# Patient Record
Sex: Female | Born: 1935
Health system: Southern US, Community
[De-identification: ages and names within clinical notes are randomized; demographics above are authoritative.]

## PROBLEM LIST (undated history)

## (undated) DIAGNOSIS — C50919 Malignant neoplasm of unspecified site of unspecified female breast: Secondary | ICD-10-CM

## (undated) DIAGNOSIS — D126 Benign neoplasm of colon, unspecified: Secondary | ICD-10-CM

## (undated) DIAGNOSIS — K589 Irritable bowel syndrome without diarrhea: Secondary | ICD-10-CM

## (undated) DIAGNOSIS — K449 Diaphragmatic hernia without obstruction or gangrene: Secondary | ICD-10-CM

## (undated) DIAGNOSIS — K5790 Diverticulosis of intestine, part unspecified, without perforation or abscess without bleeding: Secondary | ICD-10-CM

## (undated) DIAGNOSIS — E785 Hyperlipidemia, unspecified: Secondary | ICD-10-CM

## (undated) DIAGNOSIS — I10 Essential (primary) hypertension: Secondary | ICD-10-CM

## (undated) DIAGNOSIS — K648 Other hemorrhoids: Secondary | ICD-10-CM

## (undated) DIAGNOSIS — M1990 Unspecified osteoarthritis, unspecified site: Secondary | ICD-10-CM

## (undated) DIAGNOSIS — K219 Gastro-esophageal reflux disease without esophagitis: Secondary | ICD-10-CM

## (undated) DIAGNOSIS — I8390 Asymptomatic varicose veins of unspecified lower extremity: Secondary | ICD-10-CM

## (undated) HISTORY — DX: Benign neoplasm of colon, unspecified: D12.6

## (undated) HISTORY — DX: Unspecified osteoarthritis, unspecified site: M19.90

## (undated) HISTORY — DX: Other hemorrhoids: K64.8

## (undated) HISTORY — DX: Hyperlipidemia, unspecified: E78.5

## (undated) HISTORY — DX: Irritable bowel syndrome, unspecified: K58.9

## (undated) HISTORY — DX: Essential (primary) hypertension: I10

## (undated) HISTORY — PX: CYSTECTOMY: SUR359

## (undated) HISTORY — DX: Gastro-esophageal reflux disease without esophagitis: K21.9

## (undated) HISTORY — DX: Diverticulosis of intestine, part unspecified, without perforation or abscess without bleeding: K57.90

## (undated) HISTORY — DX: Diaphragmatic hernia without obstruction or gangrene: K44.9

## (undated) HISTORY — DX: Malignant neoplasm of unspecified site of unspecified female breast: C50.919

## (undated) HISTORY — DX: Asymptomatic varicose veins of unspecified lower extremity: I83.90

## (undated) HISTORY — PX: CATARACT EXTRACTION, BILATERAL: SHX1313

---

## 1985-06-15 DIAGNOSIS — C50919 Malignant neoplasm of unspecified site of unspecified female breast: Secondary | ICD-10-CM | POA: Insufficient documentation

## 1985-06-15 HISTORY — PX: MASTECTOMY: SHX3

## 1993-02-13 DIAGNOSIS — D126 Benign neoplasm of colon, unspecified: Secondary | ICD-10-CM

## 1993-02-13 HISTORY — DX: Benign neoplasm of colon, unspecified: D12.6

## 1998-01-11 ENCOUNTER — Other Ambulatory Visit: Admission: RE | Admit: 1998-01-11 | Discharge: 1998-01-11 | Payer: Self-pay | Admitting: Gastroenterology

## 1998-05-24 ENCOUNTER — Ambulatory Visit (HOSPITAL_COMMUNITY): Admission: RE | Admit: 1998-05-24 | Discharge: 1998-05-24 | Payer: Self-pay | Admitting: Orthopaedic Surgery

## 1998-09-19 ENCOUNTER — Other Ambulatory Visit: Admission: RE | Admit: 1998-09-19 | Discharge: 1998-09-19 | Payer: Self-pay | Admitting: Obstetrics & Gynecology

## 1998-10-16 ENCOUNTER — Ambulatory Visit (HOSPITAL_COMMUNITY): Admission: RE | Admit: 1998-10-16 | Discharge: 1998-10-16 | Payer: Self-pay | Admitting: Orthopaedic Surgery

## 1999-03-31 ENCOUNTER — Encounter: Payer: Self-pay | Admitting: Gastroenterology

## 2000-02-11 ENCOUNTER — Other Ambulatory Visit: Admission: RE | Admit: 2000-02-11 | Discharge: 2000-02-11 | Payer: Self-pay | Admitting: Obstetrics & Gynecology

## 2003-07-02 ENCOUNTER — Other Ambulatory Visit: Admission: RE | Admit: 2003-07-02 | Discharge: 2003-07-02 | Payer: Self-pay | Admitting: Obstetrics & Gynecology

## 2004-04-04 ENCOUNTER — Encounter: Payer: Self-pay | Admitting: Gastroenterology

## 2004-04-04 DIAGNOSIS — K648 Other hemorrhoids: Secondary | ICD-10-CM | POA: Insufficient documentation

## 2004-04-04 DIAGNOSIS — K5730 Diverticulosis of large intestine without perforation or abscess without bleeding: Secondary | ICD-10-CM | POA: Insufficient documentation

## 2004-05-09 ENCOUNTER — Emergency Department (HOSPITAL_COMMUNITY): Admission: EM | Admit: 2004-05-09 | Discharge: 2004-05-09 | Payer: Self-pay | Admitting: Emergency Medicine

## 2004-05-10 ENCOUNTER — Ambulatory Visit (HOSPITAL_COMMUNITY): Admission: RE | Admit: 2004-05-10 | Discharge: 2004-05-10 | Payer: Self-pay | Admitting: Emergency Medicine

## 2005-07-16 ENCOUNTER — Ambulatory Visit: Payer: Self-pay | Admitting: Gastroenterology

## 2005-09-03 ENCOUNTER — Ambulatory Visit: Payer: Self-pay | Admitting: Gastroenterology

## 2005-10-29 ENCOUNTER — Ambulatory Visit: Payer: Self-pay | Admitting: Gastroenterology

## 2006-09-28 ENCOUNTER — Ambulatory Visit: Payer: Self-pay | Admitting: Internal Medicine

## 2006-11-29 ENCOUNTER — Ambulatory Visit: Payer: Self-pay | Admitting: Gastroenterology

## 2007-10-27 DIAGNOSIS — T7840XA Allergy, unspecified, initial encounter: Secondary | ICD-10-CM | POA: Insufficient documentation

## 2007-10-27 DIAGNOSIS — J45909 Unspecified asthma, uncomplicated: Secondary | ICD-10-CM | POA: Insufficient documentation

## 2007-10-27 DIAGNOSIS — K219 Gastro-esophageal reflux disease without esophagitis: Secondary | ICD-10-CM | POA: Insufficient documentation

## 2007-10-27 DIAGNOSIS — I158 Other secondary hypertension: Secondary | ICD-10-CM | POA: Insufficient documentation

## 2007-10-27 DIAGNOSIS — K449 Diaphragmatic hernia without obstruction or gangrene: Secondary | ICD-10-CM | POA: Insufficient documentation

## 2007-10-27 DIAGNOSIS — E785 Hyperlipidemia, unspecified: Secondary | ICD-10-CM | POA: Insufficient documentation

## 2007-11-28 ENCOUNTER — Encounter: Payer: Self-pay | Admitting: Gastroenterology

## 2007-12-02 DIAGNOSIS — Z860100 Personal history of colon polyps, unspecified: Secondary | ICD-10-CM | POA: Insufficient documentation

## 2007-12-05 ENCOUNTER — Ambulatory Visit: Payer: Self-pay | Admitting: Gastroenterology

## 2008-06-09 IMAGING — CR DG CHEST 2V
2 series · 2 of 2 positions shown · non-contrast
Comparison: None

CLINICAL DATA: Cough

CHEST - 2 VIEW:

[view not recorded (1 of 2)]
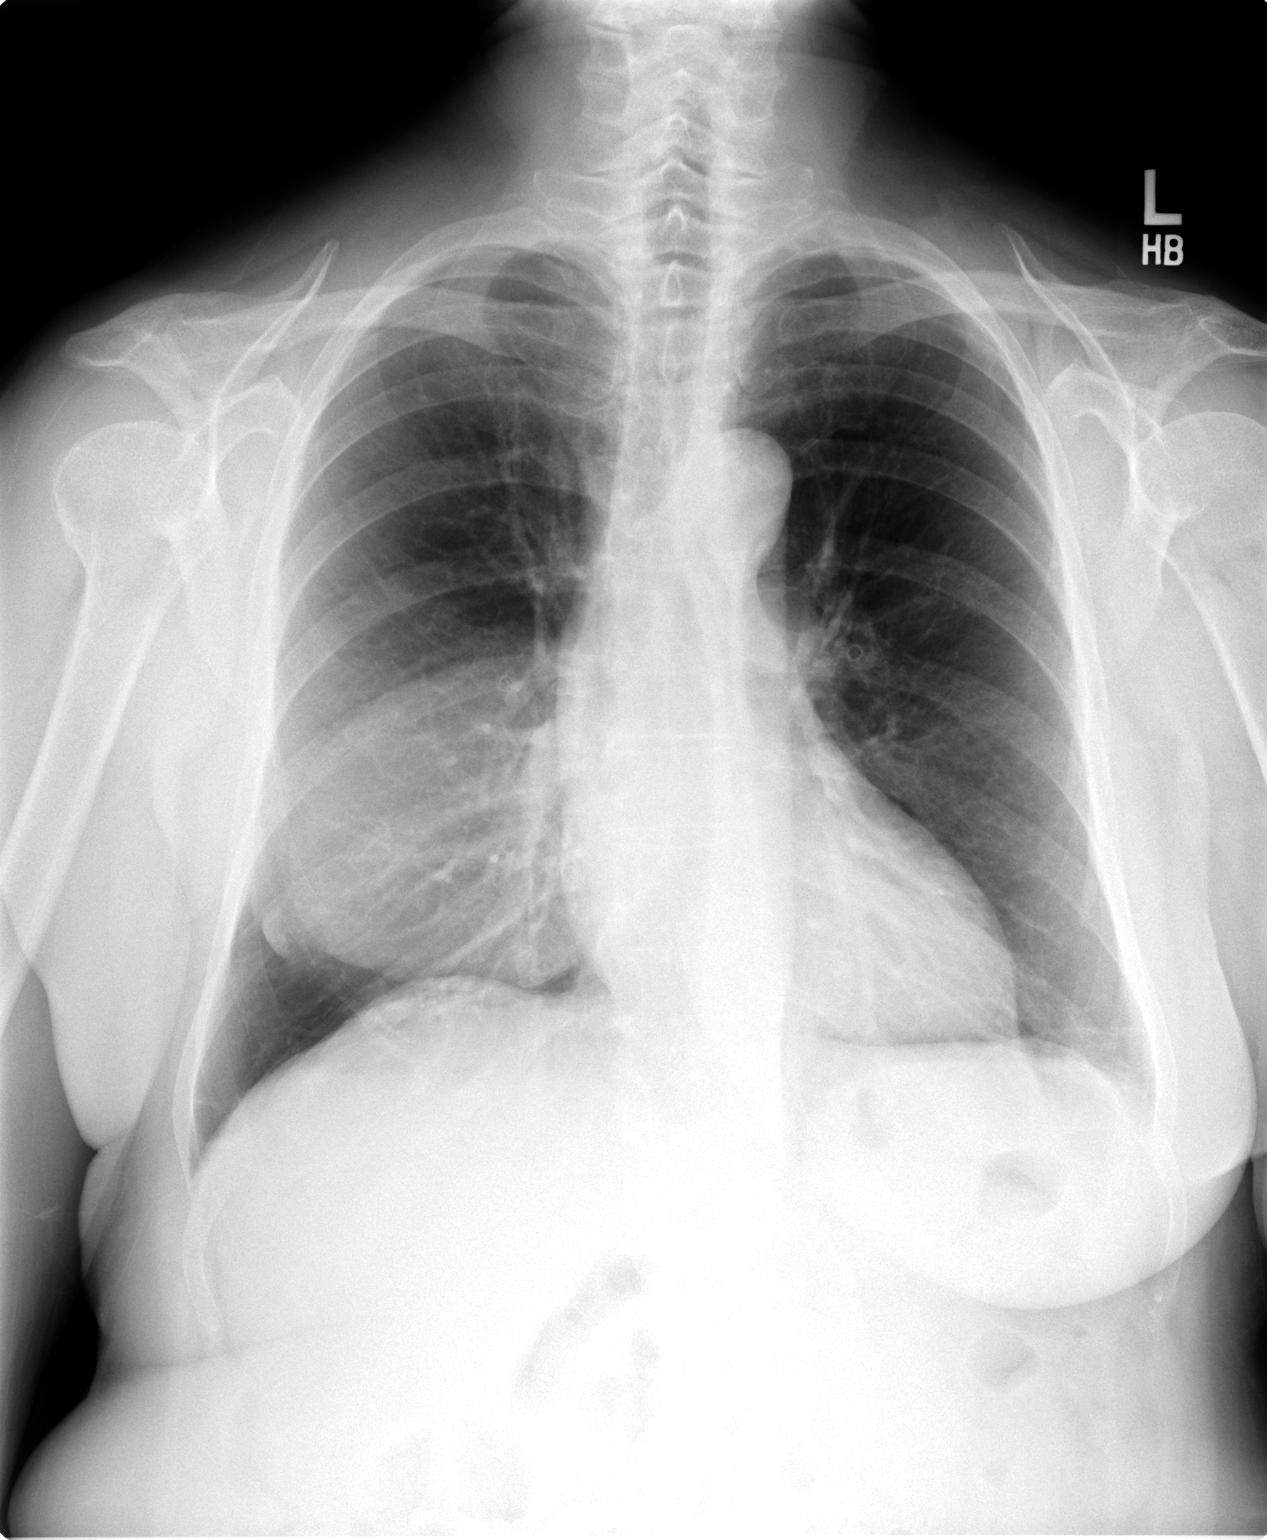

[view not recorded (2 of 2)]
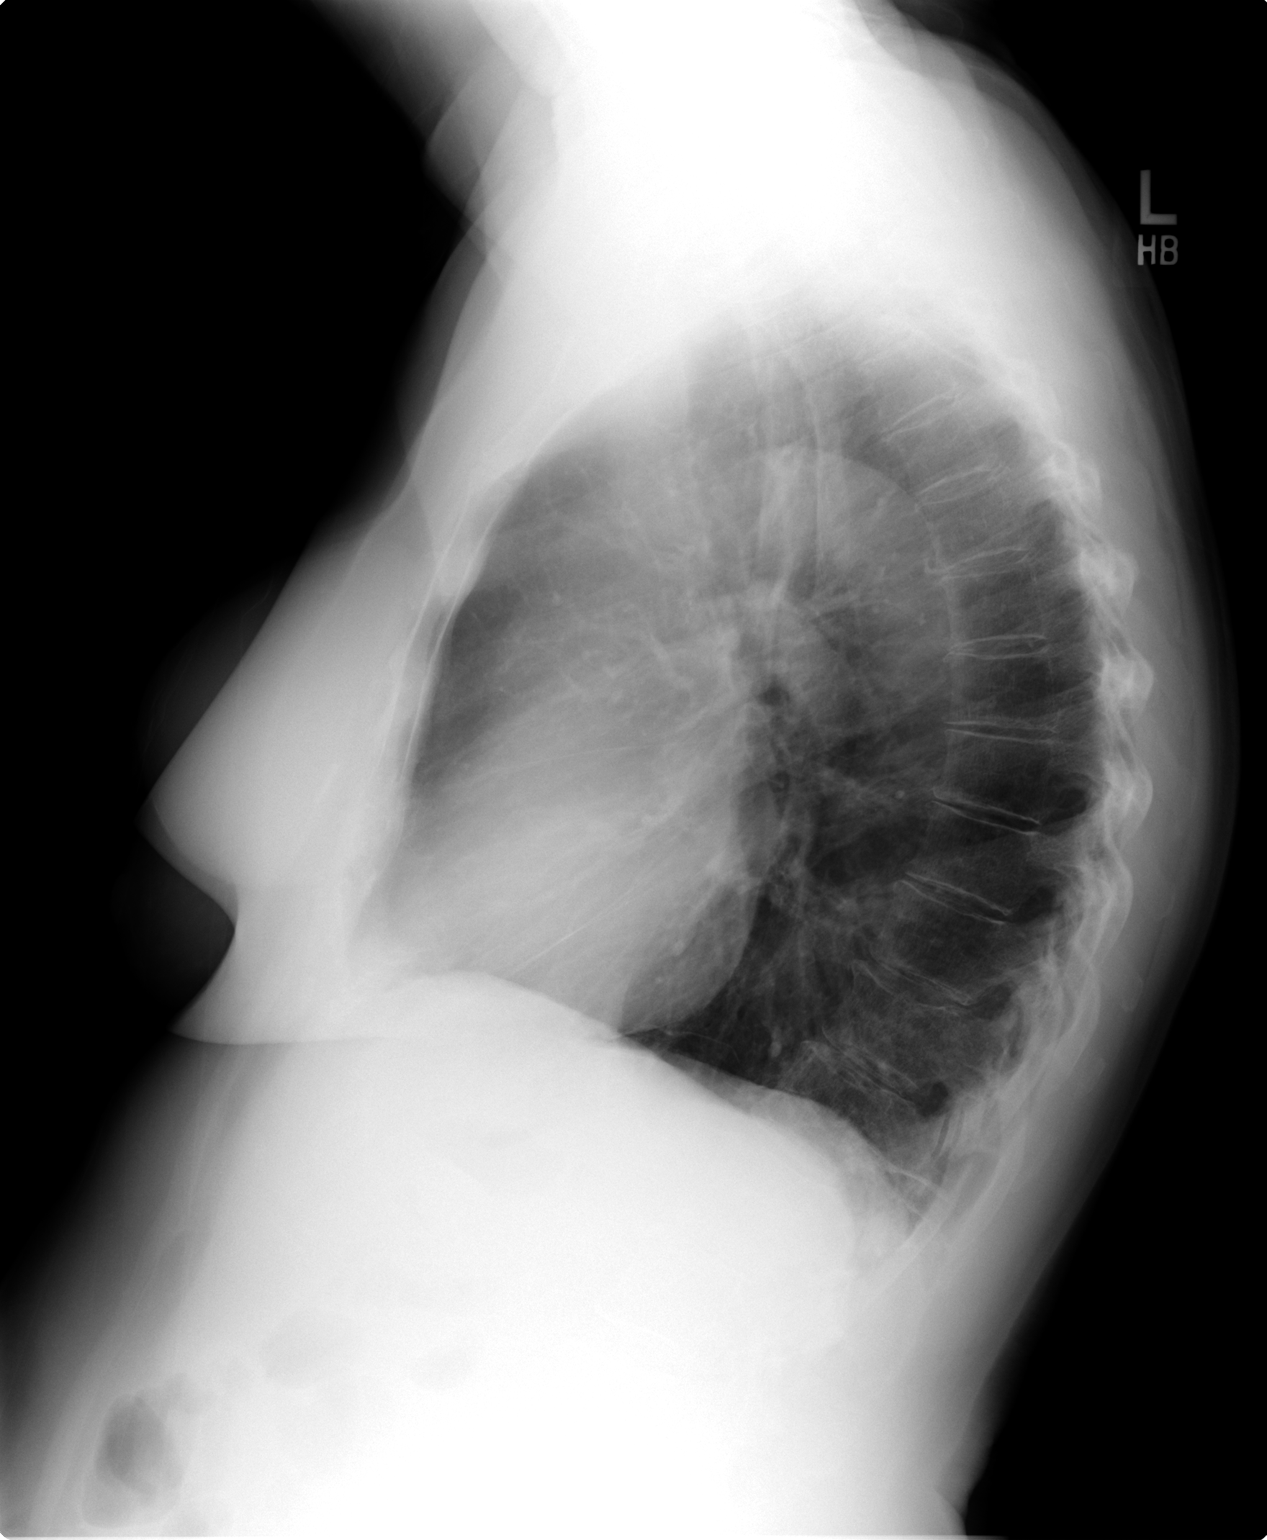

[2 of 2 positions shown; findings below may reference images not displayed]

FINDINGS: Cardiomediastinal contours are within normal limits. No focal air
space opacities or effusions in the lungs. Visualized skeleton unremarkable.
Right breast implant noted.
IMPRESSION: No active disease.

## 2008-12-14 ENCOUNTER — Encounter: Payer: Self-pay | Admitting: Gastroenterology

## 2009-01-08 ENCOUNTER — Ambulatory Visit: Payer: Self-pay | Admitting: Gastroenterology

## 2009-01-08 DIAGNOSIS — K589 Irritable bowel syndrome without diarrhea: Secondary | ICD-10-CM | POA: Insufficient documentation

## 2009-02-14 ENCOUNTER — Encounter: Payer: Self-pay | Admitting: Nurse Practitioner

## 2009-02-25 ENCOUNTER — Telehealth: Payer: Self-pay | Admitting: Gastroenterology

## 2009-02-25 ENCOUNTER — Ambulatory Visit: Payer: Self-pay | Admitting: Gastroenterology

## 2009-02-25 DIAGNOSIS — R197 Diarrhea, unspecified: Secondary | ICD-10-CM | POA: Insufficient documentation

## 2009-02-25 DIAGNOSIS — R1084 Generalized abdominal pain: Secondary | ICD-10-CM | POA: Insufficient documentation

## 2009-02-25 LAB — CONVERTED CEMR LAB
Bilirubin Urine: NEGATIVE
Ketones, ur: NEGATIVE mg/dL
Nitrite: NEGATIVE
Specific Gravity, Urine: 1.025 (ref 1.000–1.030)
Total Protein, Urine: NEGATIVE mg/dL
Urine Glucose: NEGATIVE mg/dL
Urobilinogen, UA: 0.2 (ref 0.0–1.0)
pH: 5 (ref 5.0–8.0)

## 2009-03-22 ENCOUNTER — Telehealth (INDEPENDENT_AMBULATORY_CARE_PROVIDER_SITE_OTHER): Payer: Self-pay | Admitting: *Deleted

## 2009-03-25 ENCOUNTER — Encounter: Payer: Self-pay | Admitting: Gastroenterology

## 2009-03-25 ENCOUNTER — Ambulatory Visit: Payer: Self-pay | Admitting: Gastroenterology

## 2009-03-26 ENCOUNTER — Telehealth: Payer: Self-pay | Admitting: Gastroenterology

## 2009-03-26 ENCOUNTER — Encounter: Payer: Self-pay | Admitting: Gastroenterology

## 2010-03-24 ENCOUNTER — Ambulatory Visit: Payer: Self-pay | Admitting: Gastroenterology

## 2010-07-17 NOTE — Progress Notes (Signed)
Summary: Pin right side   Phone Note Call from Patient Call back at Home Phone [phone]   Call For: DR Russella Dar Reason for Call: Talk to Nurse Summary of Call: Pain in her right side-would like to get checked out before Oct which is next available. Initial call taken by: Leanor [facility],  February 25, 2009 8:10 AM  Follow-up for Phone Call        Patient  with abdominal pressure and pain that started on Friday.  Patient  states excess gas over the weekend, now left with LLQ abdominal pain and tenderness.  Patient  will come in and see Willette Cluster RNP this am at 10:30 Follow-up by: Darcey Nora RN, CGRN,  February 25, 2009 8:32 AM

## 2010-07-17 NOTE — Assessment & Plan Note (Signed)
Summary: CK-UP/FH    History of Present Illness Visit Type: follow up Primary GI MD: Elie Goody MD [facility] Primary Provider: Adela Lank, M.D. Chief Complaint:  Patient states that her reflux is well controlled on Protonix. She does note some new early satiety at times. No other GI complaints at this time. History of Present Illness:   Nancy Webb has notes mild early satiety and no other gastrointestinal complaints. Her reflux symptoms are very well controlled on Protonix 40 mg daily. She has a history of LA classification grade B erosive esophagitis diagnosed on endoscopy in October 2000.   GI Review of Systems      Denies abdominal pain, acid reflux, belching, bloating, chest pain, dysphagia with liquids, dysphagia with solids, heartburn, loss of appetite, nausea, vomiting, vomiting blood, weight loss, and  weight gain.        Denies anal fissure, black tarry stools, change in bowel habit, constipation, diarrhea, diverticulosis, fecal incontinence, heme positive stool, hemorrhoids, irritable bowel syndrome, jaundice, light color stool, liver problems, rectal bleeding, and  rectal pain.     Prior Medications Reviewed Using: Patient Recall  Updated Prior Medication List: PROTONIX 40 MG  TBEC (PANTOPRAZOLE SODIUM) 1 each day 30 minutes before meal BENICAR 20 MG  TABS (OLMESARTAN MEDOXOMIL) Take 1 tablet by mouth once a day ASPIRIN 81 MG  TBEC (ASPIRIN) Take 1 tablet by mouth once a day SINGULAIR 10 MG  TABS (MONTELUKAST SODIUM) Take 1 tablet by mouth once a day LESCOL XL 80 MG  TB24 (FLUVASTATIN SODIUM) Take 1 tablet by mouth once a day DITROPAN XL 10 MG  TB24 (OXYBUTYNIN CHLORIDE) Take 1 tablet by mouth once a day  Current Allergies (reviewed today): ! CLARITIN  Past Medical History:    Reviewed history from 12/02/2007 and no changes required:       COLONIC POLYPS, ADENOMATOUS, HX OF (ICD-V12.72) 02/1993       HYPERLIPIDEMIA (ICD-272.4)       HYPERTENSION, SECONDARY  (ICD-405.99)       ALLERGY (ICD-995.3)       ASTHMA (ICD-493.90)       HIATAL HERNIA (ICD-553.3)       GERD (ICD-530.81)       HEMORRHOIDS, INTERNAL (ICD-455.0)       DIVERTICULOSIS, COLON (ICD-562.10)       BREAST CANCER                       Past Surgical History:    Reviewed history from 10/27/2007 and no changes required:       Right Mastectomy 1987       right ovary cystectomy   Family History:    Reviewed history and no changes required:       Family History of Breast Cancer: Maternal Aunt x 2               Patient unaware of any further family history  Social History:    Reviewed history from 12/02/2007 and no changes required:       Occupation: Accounting       Patient has never smoked.        Alcohol Use - yes-on occasion       Daily Caffeine Use-2 cups daily       Illicit Drug Use - no       Patient gets regular exercise.   Risk Factors:  Drug use:  no Alcohol use:  yes Exercise:  yes   Review of  Systems       The patient complains of allergy/sinus, arthritis/joint pain, and cough.         The pertinent positives and negatives are noted as above and in the HPI. All other ROS were negative.    Vital Signs:  Patient Profile:   77 Years Old Female Height:     63 inches Weight:      141.13 pounds BMI:     25.09 BSA:     1.67 Pulse rate:   60 / minute Pulse rhythm:   regular BP sitting:   142 / 90  (left arm)  Vitals Entered By: Hortense Ramal CMA (December 05, 2007 10:51 AM)                  Physical Exam  Head:     Normocephalic and atraumatic. Eyes:     PERRLA, no icterus. Mouth:     No deformity or lesions, dentition normal. Neck:     Supple; no masses or thyromegaly. Lungs:     Clear throughout to auscultation. Heart:     Regular rate and rhythm; no murmurs, rubs,  or bruits. Abdomen:     Soft, nontender and nondistended. No masses, hepatosplenomegaly or hernias noted. Normal bowel sounds. Neurologic:     Alert and  oriented  x4;  grossly normal neurologically. Cervical Nodes:     No significant cervical adenopathy. Psych:     Alert and cooperative. Normal mood and affect.    Impression & Recommendations:  Problem # 1:  GERD (ICD-530.81) History of erosive esophagitis. Continue standard antireflux measures. Change to omeprazole 40 mg p.o. q.a.m. taken 30 minutes before breakfast. She notes mild early satiety. Her weight has been stable and she has no other gastrointestinal complaints. If his symptom worsens or becomes associated with any other gastrointestinal complaints she is to call for further evauation.  Problem # 2:  COLONIC POLYPS, ADENOMATOUS, HX OF (ICD-V12.72) Surveillance colonoscopy recommended in October 2010. She states she may want to schedule this in the summer of 2010 due to her daughters work schedule and this of course is fine.   Patient Instructions: 1)  Avoid foods high in acid content (tomatoes, citrus juices, spicy foods). Avoid eating within 3 to 4 hours of lying down or before exercising. Do not over eat; try smaller more frequent meals. Elevate head of bed four inches when sleeping. 2)  Copy Sent To: Adela Lank, MD    Prescriptions: OMEPRAZOLE 40 MG  CPDR (OMEPRAZOLE) 1 each day 30 minutes before meal  #30 x 11   Entered by:   Christie Nottingham CMA   Authorized by:   Meryl Dare MD [facility]   Signed by:   Christie Nottingham CMA on 12/05/2007   Method used:   Electronically sent to ...       [facility]  [address] # [id number]*       [address]       [place], [place]  [address]       Ph: [phone] or [phone]       Fax: [phone]   RxID:   [id number]  ]

## 2010-07-17 NOTE — Procedures (Signed)
Summary: Colonoscopy   Colonoscopy  Procedure date:  03/25/2009  Findings:      Location:  [facility].    Procedures Next Due Date:    Colonoscopy: 03/2014 COLONOSCOPY PROCEDURE REPORT  PATIENT:  Arisha, Gervais  MR#:  [id number] BIRTHDATE:   [birth year], 77 yrs. old   GENDER:   female  ENDOSCOPIST:   Judie Petit T. Russella Dar, MD, [facility]    PROCEDURE DATE:  03/25/2009 PROCEDURE:  Colonoscopy with biopsy and snare polypectomy ASA CLASS:   Class II INDICATIONS: 1) unexplained diarrhea  2) abdominal pain  3) follow-up of polyp, adenomatous polyps, 1994.  MEDICATIONS:    Fentanyl 75 mcg, Versed 7 mg IV  DESCRIPTION OF PROCEDURE:   After the risks benefits and alternatives of the procedure were thoroughly explained, informed consent was obtained.  Digital rectal exam was performed and revealed no abnormalities.   The LB [id number] endoscope was introduced through the anus and advanced to the terminal ileum which was intubated for a short distance, without limitations.  The quality of the prep was good, using MoviPrep.  The instrument was then slowly withdrawn as the colon was fully examined. <<PROCEDUREIMAGES>>              <<OLD IMAGES>>  FINDINGS:  Two polyps were found in the proximal transverse colon. They were 4 - 6 mm in size. Polyps were snared without cautery. Retrieval was successful. Moderate diverticulosis was found sigmoid to descending.  Remainder of colon normal. Random biopsies were obtained and sent to pathology.  The terminal ileum appeared normal.   Retroflexed views in the rectum revealed internal hemorrhoids, small.  The time to cecum =  3  minutes. The scope was then withdrawn (time =  10.75  min) from the patient and the procedure completed.  COMPLICATIONS:   None  ENDOSCOPIC IMPRESSION:  1) 4 - 6 mm Two polyps in the proximal transverse colon  2) Moderate diverticulosis in the sigmoid to descending  3) Internal hemorrhoids  RECOMMENDATIONS:  1)  Await pathology results  2) Repeat Colonoscopy in 5 years.  3) high fiber diet 4) continue treatment for IBS     Mauro Arps T. Russella Dar, MD, [facility]   CC: Adela Lank, MD      REPORT OF SURGICAL PATHOLOGY   Case #: [id number] Patient Name: MADDIX, KLIEWER Office Chart Number:  N/A [id number] MRN: [id number] Pathologist: Ivin Booty B. Colonel Bald, MD DOB/Age  [birth year] (Age: 70)    Gender: F Date Taken:  03/25/2009 Date Received: 03/25/2009   FINAL DIAGNOSIS   ***MICROSCOPIC EXAMINATION AND DIAGNOSIS***   1.  COLON, TRANSVERSE, POLYPS (2):   -  TUBULAR ADENOMA. -  SERRATED ADENOMA. -  HIGH GRADE DYSPLASIA IS NOT IDENTIFIED.   2.  COLON, RANDOM, BIOPSY: -  BENIGN COLONIC MUCOSA.   -  NO SIGNIFICANT INFLAMMATION OR OTHER ABNORMALITIES IDENTIFIED.    mw Date Reported:  03/26/2009     Ivin Booty B. Colonel Bald, MD *** Electronically Signed Out By JBK ***    March 26, 2009 MRN: [id number]    [facility] [address] [place], [place]  [address]    Dear Ms. Zelman,  I am pleased to inform you that the colon polyp(s) removed during your recent colonoscopy was (were) found to be benign (no cancer detected) upon pathologic examination.  The random colon biopsies were normal.  I recommend you have a repeat colonoscopy examination in 5 years to look for recurrent polyps, as having colon polyps increases your risk for having  recurrent polyps or even colon cancer in the future.  Should you develop new or worsening symptoms of abdominal pain, bowel habit changes or bleeding from the rectum or bowels, please schedule an evaluation with either your primary care physician or with me.  Continue treatment plan as outlined the day of your exam.  Please call us if you are having persistent problems or have questions about your condition that have not been fully answered at this time.  Sincerely,  Meryl Dare MD [facility]  This letter has been electronically signed by your  physician.    This report was created from the original endoscopy report, which was reviewed and signed by the above listed endoscopist.

## 2010-07-17 NOTE — Assessment & Plan Note (Signed)
Summary: YEARLY F-UP/YF   History of Present Illness Visit Type: follow up Primary GI MD: Elie Goody MD [facility] Primary Provider: Adela Lank, MD Requesting Provider: n/a Chief Complaint: F/u from office visit for acid reflux. Pt states that she is better and denies any GI complaints. History of Present Illness:   Ms. Vandermeer returns for followup and her reflux symptoms are under good control. Over the past few weeks she has noted frequent bowel movements following meals however, the symptoms resolved on their own several days ago. She also notes occasional lower abdominal pain relieved by bowel movements and has noted these symptoms intermittently for several years.   GI Review of Systems      Denies abdominal pain, acid reflux, belching, bloating, chest pain, dysphagia with liquids, dysphagia with solids, heartburn, loss of appetite, nausea, vomiting, vomiting blood, weight loss, and  weight gain.        Denies anal fissure, black tarry stools, change in bowel habit, constipation, diarrhea, diverticulosis, fecal incontinence, heme positive stool, hemorrhoids, irritable bowel syndrome, jaundice, light color stool, liver problems, rectal bleeding, and  rectal pain.   Current Medications (verified): 1)  Omeprazole 40 Mg  Cpdr (Omeprazole) .Marland Kitchen.. 1 Each Day 30 Minutes Before Meal. Must Have Office Visit For Further Refills! 2)  Benicar 20 Mg  Tabs (Olmesartan Medoxomil) .... Take 1 Tablet By Mouth Once A Day 3)  Aspirin 81 Mg  Tbec (Aspirin) .... Take 1 Tablet By Mouth Once A Day 4)  Singulair 10 Mg  Tabs (Montelukast Sodium) .... Take 1 Tablet By Mouth Once A Day 5)  Lescol Xl 80 Mg  Tb24 (Fluvastatin Sodium) .... Take 1 Tablet By Mouth Once A Day 6)  Ditropan Xl 10 Mg  Tb24 (Oxybutynin Chloride) .... Take 1 Tablet By Mouth Once A Day  Allergies (verified): 1)  ! Claritin  Past History:  Past Medical History: Reviewed history from 12/05/2007 and no changes required. COLONIC  POLYPS, ADENOMATOUS, HX OF (ICD-V12.72) 02/1993 HYPERLIPIDEMIA (ICD-272.4) HYPERTENSION, SECONDARY (ICD-405.99) ALLERGY (ICD-995.3) ASTHMA (ICD-493.90) HIATAL HERNIA (ICD-553.3) GERD (ICD-530.81) HEMORRHOIDS, INTERNAL (ICD-455.0) DIVERTICULOSIS, COLON (ICD-562.10) BREAST CANCER  Past Surgical History: Reviewed history from 12/05/2007 and no changes required. Right Mastectomy 1987 right ovary cystectomy  Family History: Reviewed history from 12/05/2007 and no changes required. Family History of Breast Cancer: Maternal Aunt x 2  Patient unaware of any further family history  Social History: Reviewed history from 12/05/2007 and no changes required. 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  Alcohol Use - yes-on occasion Daily Caffeine Use-2 cups daily Illicit Drug Use - no Patient gets regular exercise.  Review of Systems       The pertinent positives and negatives are noted as above and in the HPI. All other ROS were reviewed and were negative.\par   Vital Signs:  Patient profile:   77 year old female Height:      63 inches Weight:      142 pounds BMI:     25.25 BSA:     1.67 Pulse rate:   64 / minute Pulse rhythm:   regular BP sitting:   136 / 80  (right arm) Cuff size:   regular  Vitals Entered By: Ok Anis CMA (January 08, 2009 10:41 AM)  Physical Exam  General:  Well developed, well nourished, no acute distress. Head:  Normocephalic and atraumatic. Mouth:  No deformity or lesions, dentition normal. Lungs:  Clear throughout to auscultation. Heart:  Regular rate and rhythm; no murmurs, rubs,  or  bruits. Abdomen:  Soft, nontender and nondistended. No masses, hepatosplenomegaly or hernias noted. Normal bowel sounds. Psych:  Alert and cooperative. Normal mood and affect.   Impression & Recommendations:  Problem # 1:  GERD (ICD-530.81) Continue omeprazole 40 mg q.a.m. and standard antireflux measures.  Problem # 2:  COLONIC POLYPS, ADENOMATOUS, HX OF  (ICD-V12.72) Surveillance colonoscopy in due in October 2010.  Problem # 3:  IRRITABLE BOWEL SYNDROME (ICD-564.1) Presumed irritable bowel syndrome. Colonoscopy for further evaluation. Trial of Levsin 1-2 sublingually as needed.  Patient Instructions: 1)  Pick up your prescriptions at your pharmacy.  2)  Please schedule a follow-up appointment in 1 year. 3)  Copy sent to : Adela Lank, MD 4)  The medication list was reviewed and reconciled.  All changed / newly prescribed medications were explained.  A complete medication list was provided to the patient / caregiver.  Prescriptions: LEVSIN/SL 0.125 MG SUBL (HYOSCYAMINE SULFATE) 1-2 tablets by mouth four times a day as needed  #60 x 11   Entered by:   Christie Nottingham CMA (AAMA)   Authorized by:   Meryl Dare MD [facility]   Signed by:   Meryl Dare MD [facility] on 01/08/2009   Method used:   Electronically to        [facility] # [id number]* (retail)       [address]       [place], [place]  [address]       Ph: [phone] or [phone]       Fax: [phone]   RxID:   [id number] OMEPRAZOLE 40 MG  CPDR (OMEPRAZOLE) 1 each day 30 minutes before meal.  #30 x 11   Entered by:   Christie Nottingham CMA (AAMA)   Authorized by:   Meryl Dare MD [facility]   Signed by:   Meryl Dare MD [facility] on 01/08/2009   Method used:   Electronically to        [facility] # [id number]* (retail)       [address]       [place], [place]  [address]       Ph: [phone] or [phone]       Fax: [phone]   RxID:   [id number]

## 2010-07-17 NOTE — Procedures (Signed)
Summary: Gastroenterology COLON  Gastroenterology COLON   Imported By: Thereasa Solo 11/04/2007 12:37:58  _____________________________________________________________________  External Attachment:    Type:   Image     Comment:   External Document

## 2010-07-17 NOTE — Miscellaneous (Signed)
Summary: Omeprazole Rx  Clinical Lists Changes  Medications: Changed medication from OMEPRAZOLE 40 MG  CPDR (OMEPRAZOLE) 1 each day 30 minutes before meal to OMEPRAZOLE 40 MG  CPDR (OMEPRAZOLE) 1 each day 30 minutes before meal. MUST HAVE OFFICE VISIT FOR FURTHER REFILLS! - Signed Rx of OMEPRAZOLE 40 MG  CPDR (OMEPRAZOLE) 1 each day 30 minutes before meal. MUST HAVE OFFICE VISIT FOR FURTHER REFILLS!;  #30 x 0;  Signed;  Entered by: Hortense Ramal CMA;  Authorized by: Meryl Dare MD [facility];  Method used: Electronically to [facility]. # [id number]*, [address] [place], [place], [place]  [address], Ph: [phone] or [phone], Fax: [phone]    Prescriptions: OMEPRAZOLE 40 MG  CPDR (OMEPRAZOLE) 1 each day 30 minutes before meal. MUST HAVE OFFICE VISIT FOR FURTHER REFILLS!  #30 x 0   Entered by:   Hortense Ramal CMA   Authorized by:   Meryl Dare MD [facility]   Signed by:   Hortense Ramal CMA on 12/14/2008   Method used:   Electronically to        [facility] # [id number]* (retail)       [address]       [place], [place]  [address]       Ph: [phone] or [phone]       Fax: [phone]   RxID:   [id number]

## 2010-07-17 NOTE — Miscellaneous (Signed)
Summary: rx refill for Protonix  Clinical Lists Changes  Medications: Added new medication of PROTONIX 40 MG  TBEC (PANTOPRAZOLE SODIUM) 1 each day 30 minutes before meal - Signed Rx of PROTONIX 40 MG  TBEC (PANTOPRAZOLE SODIUM) 1 each day 30 minutes before meal;  #30 x 0;  Signed;  Entered by: Christie Nottingham CMA;  Authorized by: Meryl Dare MD [facility];  Method used: Electronic    Prescriptions: PROTONIX 40 MG  TBEC (PANTOPRAZOLE SODIUM) 1 each day 30 minutes before meal  #30 x 0   Entered by:   Christie Nottingham CMA   Authorized by:   Meryl Dare MD [facility]   Signed by:   Christie Nottingham CMA on 11/28/2007   Method used:   Electronically sent to ...       [facility]  [address] # [id number]*       [address]       [place], [place]  [address]       Ph: [phone] or [phone]       Fax: [phone]   RxID:   [id number]

## 2010-07-17 NOTE — Assessment & Plan Note (Signed)
Summary: OV - NEEDS MEDS REFILLED...LSW.   History of Present Illness Visit Type: na Primary GI MD: Elie Goody MD [facility] Primary Provider: Adela Lank, MD Requesting Provider: n/a Chief Complaint: GERD. Pt states that she needs a refill on Omeprazole and Levsin  History of Present Illness:   This is a 77 year old female with a history of GERD and irritable bowel syndrome. Her reflux symptoms are under good control on omeprazole 40 mg q.a.m. She has had no recent flares of  irritable bowel syndrome.   GI Review of Systems    Reports acid reflux and  heartburn.      Denies abdominal pain, belching, bloating, chest pain, dysphagia with liquids, dysphagia with solids, loss of appetite, nausea, vomiting, vomiting blood, weight loss, and  weight gain.        Denies anal fissure, black tarry stools, change in bowel habit, constipation, diarrhea, diverticulosis, fecal incontinence, heme positive stool, hemorrhoids, irritable bowel syndrome, jaundice, light color stool, liver problems, rectal bleeding, and  rectal pain.   Current Medications (verified): 1)  Omeprazole 40 Mg  Cpdr (Omeprazole) .Marland Kitchen.. 1 Each Day 30 Minutes Before Meal. 2)  Diovan Hct 160-25 Mg Tabs (Valsartan-Hydrochlorothiazide) .... One Tablet By Mouth Once Daily 3)  Aspirin 81 Mg  Tbec (Aspirin) .... Take 1 Tablet Once Daily (When She Remembers) 4)  Singulair 10 Mg  Tabs (Montelukast Sodium) .... Take 1 Tablet By Mouth Once A Day 5)  Pravastatin Sodium 80 Mg Tabs (Pravastatin Sodium) .... One Tablet By Mouth Once Daily 6)  Ditropan Xl 10 Mg  Tb24 (Oxybutynin Chloride) .... Take 1 Tablet By Mouth Once A Day 7)  Levsin/sl 0.125 Mg Subl (Hyoscyamine Sulfate) .Marland Kitchen.. 1-2 Tablets By Mouth Four Times A Day As Needed  Allergies (verified): 1)  ! Claritin  Past History:  Past Medical History: COLONIC POLYPS, ADENOMATOUS, HX OF (ICD-V12.72) 02/1993, 03/2009 HYPERLIPIDEMIA (ICD-272.4) HYPERTENSION, SECONDARY (ICD-405.99) ALLERGY  (ICD-995.3) ASTHMA (ICD-493.90) HIATAL HERNIA (ICD-553.3) GERD (ICD-530.81) HEMORRHOIDS, INTERNAL (ICD-455.0) DIVERTICULOSIS, COLON (ICD-562.10) BREAST CANCER-Right Breast Arthritis IBS  Past Surgical History: Right Mastectomy 1987 Right ovary cystectomy  Family History: Reviewed history from 02/25/2009 and no changes required. Family History of Breast Cancer: Maternal Aunt x 2  Patient unaware of any further family history Colectomy-father   ? unsure if it was colon cancer  Social History: Reviewed history from 02/25/2009 and no changes required. 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  Alcohol Use - yes-on occasion Daily Caffeine Use-2 cups daily Illicit Drug Use - no Patient gets regular exercise. Widowed   1 girl 1 boy  Review of Systems       The patient complains of allergy/sinus, arthritis/joint pain, back pain, fatigue, and muscle pains/cramps.         The pertinent positives and negatives are noted as above and in the HPI. All other ROS were reviewed and were negative.   Vital Signs:  Patient profile:   77 year old female Height:      60.25 inches Weight:      141 pounds BMI:     27.41 BSA:     1.62 Pulse rate:   64 / minute Pulse rhythm:   regular BP sitting:   132 / 76  (left arm) Cuff size:   regular  Vitals Entered By: Ok Anis CMA (March 24, 2010 10:19 AM)  Physical Exam  General:  Well developed, well nourished, no acute distress. Head:  Normocephalic and atraumatic. Eyes:  PERRLA, no icterus. Mouth:  No deformity  or lesions, dentition normal. Lungs:  Clear throughout to auscultation. Heart:  Regular rate and rhythm; no murmurs, rubs,  or bruits. Abdomen:  Soft, nontender and nondistended. No masses, hepatosplenomegaly or hernias noted. Normal bowel sounds. Psych:  Alert and cooperative. Normal mood and affect.   Impression & Recommendations:  Problem # 1:  GERD (ICD-530.81) Continue omeprazole 40 mg p.o. q.a.m. and  standard antireflux measures.  Problem # 2:  IRRITABLE BOWEL SYNDROME (ICD-564.1) Currently inactive. Refill Levsin SL for as needed use.  Problem # 3:  PERSONAL HX COLONIC POLYPS (ICD-V12.72) Personal history of adenomatous colon polyps. Surveillance colonoscopy recommended October 2015.  Problem # 4:  DIVERTICULOSIS, COLON (ICD-562.10) Long-term high fiber diet with adequate water intake.  Patient Instructions: 1)  Your prescriptions have been sent to your pharmacy.  2)  Please continue current medications.  3)  Please schedule a follow-up appointment in 1 year. 4)  Copy sent to : Adela Lank, MD 5)  The medication list was reviewed and reconciled.  All changed / newly prescribed medications were explained.  A complete medication list was provided to the patient / caregiver.  Prescriptions: OMEPRAZOLE 40 MG  CPDR (OMEPRAZOLE) 1 each day 30 minutes before meal.  #30 x 11   Entered by:   Christie Nottingham CMA (AAMA)   Authorized by:   Meryl Dare MD [facility]   Signed by:   Christie Nottingham CMA (AAMA) on 03/24/2010   Method used:   Electronically to        [id number] (retail)       [address]       [place], [place]  [address]       Ph: [phone] or [phone]       Fax: [phone]   RxID:   [id number] LEVSIN/SL 0.125 MG SUBL (HYOSCYAMINE SULFATE) 1-2 tablets by mouth four times a day as needed  #30 x 11   Entered by:   Christie Nottingham CMA (AAMA)   Authorized by:   Meryl Dare MD [facility]   Signed by:   Christie Nottingham CMA (AAMA) on 03/24/2010   Method used:   Electronically to        [id number]* (retail)       [address]       [place], [place]  [address]       Ph: [phone] or [phone]       Fax: [phone]   RxID:   [id number]

## 2010-07-17 NOTE — Assessment & Plan Note (Signed)
Summary: LLQ abdominal pain/sheri    History of Present Illness Visit Type: Follow-up Visit Primary GI MD: Elie Goody MD [facility] Primary Provider: Adela Lank, MD Requesting Provider: n/a Chief Complaint: pt. c/o LLQ abdominal pain and frequent BMs History of Present Illness:   Saw Dr. Russella Dar end of July for GERD follow up. At that time complained of increased freq of stools, mild lower abdominal cramps relieved with defecation. Symptoms improved, was doing okay until  two days ago when she developed severe gas-like pain across mid to lower abdomen. Sneezing worsened pain. Took several Levsin throughout the day without relief. Finally fell asleep at 6am. Much better yesterday and today. This abdominal pain was different than that  described at last visit with Dr. Russella Dar. She is also having increased freq. of stools again. Has several loose stools in the morning.    GI Review of Systems     Location of  Abdominal pain: mid abdominal.    Denies abdominal pain, acid reflux, belching, bloating, chest pain, dysphagia with liquids, dysphagia with solids, heartburn, loss of appetite, nausea, vomiting, vomiting blood, weight loss, and  weight gain.      Reports diarrhea.     Denies anal fissure, black tarry stools, change in bowel habit, constipation, diverticulosis, fecal incontinence, heme positive stool, hemorrhoids, irritable bowel syndrome, jaundice, light color stool, liver problems, rectal bleeding, and  rectal pain.    Current Medications (verified): 1)  Omeprazole 40 Mg  Cpdr (Omeprazole) .Marland Kitchen.. 1 Each Day 30 Minutes Before Meal. 2)  Benicar 20 Mg  Tabs (Olmesartan Medoxomil) .... Take 1 Tablet By Mouth Once A Day 3)  Aspirin 81 Mg  Tbec (Aspirin) .... Take 1 Tablet Once Daily (When She Remembers) 4)  Singulair 10 Mg  Tabs (Montelukast Sodium) .... Take 1 Tablet By Mouth Once A Day 5)  Lescol Xl 80 Mg  Tb24 (Fluvastatin Sodium) .... Take 1 Tablet By Mouth Once A Day 6)  Ditropan Xl 10 Mg   Tb24 (Oxybutynin Chloride) .... Take 1 Tablet By Mouth Once A Day 7)  Levsin/sl 0.125 Mg Subl (Hyoscyamine Sulfate) .Marland Kitchen.. 1-2 Tablets By Mouth Four Times A Day As Needed  Allergies (verified): 1)  ! Claritin  Past History:  Past Medical History: Reviewed history from 12/05/2007 and no changes required. COLONIC POLYPS, ADENOMATOUS, HX OF (ICD-V12.72) 02/1993 HYPERLIPIDEMIA (ICD-272.4) HYPERTENSION, SECONDARY (ICD-405.99) ALLERGY (ICD-995.3) ASTHMA (ICD-493.90) HIATAL HERNIA (ICD-553.3) GERD (ICD-530.81) HEMORRHOIDS, INTERNAL (ICD-455.0) DIVERTICULOSIS, COLON (ICD-562.10) BREAST CANCER  Past Surgical History: Reviewed history from 12/05/2007 and no changes required. Right Mastectomy 1987 right ovary cystectomy  Family History: Family History of Breast Cancer: Maternal Aunt x 2  Patient unaware of any further family history Colectomy-father   ? unsure if it was colon cancer  Social History: Occupation: Accounting Patient has never smoked.  Alcohol Use - yes-on occasion Daily Caffeine Use-2 cups daily Illicit Drug Use - no Patient gets regular exercise. Widowed   1 girl 1 boy  Review of Systems       The patient complains of anxiety-new, cough, depression-new, sleeping problems, and urination changes/pain.  The patient denies allergy/sinus, anemia, arthritis/joint pain, back pain, blood in urine, breast changes/lumps, change in vision, confusion, coughing up blood, fainting, fatigue, fever, headaches-new, hearing problems, heart murmur, heart rhythm changes, itching, muscle pains/cramps, night sweats, nosebleeds, shortness of breath, skin rash, sore throat, swelling of feet/legs, swollen lymph glands, thirst - excessive, urination - excessive, vision changes, and voice change.    Vital Signs:  Patient  profile:   77 year old female Height:      60.25 inches Weight:      138.13 pounds BMI:     26.85 BSA:     1.60 Pulse rate:   68 / minute Pulse rhythm:   regular BP  sitting:   122 / 70  (left arm)  Vitals Entered By: Milford Cage NCMA (February 25, 2009 10:52 AM)  Physical Exam  General:  Well developed, well nourished, no acute distress. Head:  Normocephalic and atraumatic. Eyes:  Conjunctiva pink, no icterus.  Lungs:  Clear throughout to auscultation. Heart:  RRR. Soft murmur best heard in sitting position over second right intercostal space.r  Abdomen:  Abdomen soft, nontender, nondistended. No obvious masses or hepatomegaly. No obvious hernias. Normal bowel sounds.  Msk:  Symmetrical with no gross deformities. Normal posture. Extremities:  No palmar erythema, no edema.  Neurologic:  Alert and  oriented x4;  grossly normal neurologically. Skin:  Intact without significant lesions or rashes. Cervical Nodes:  No significant cervical adenopathy. Psych:  Alert and cooperative. Normal mood and affect.   Impression & Recommendations:  Problem # 1:  ABDOMINAL PAIN -GENERALIZED (ICD-789.07) Assessment New Recent episode of mid abdominal pain, different from her chronic intermittent IBS pain. No improvement with Levsin. Better today. Will check urine to rule out UTI. Also, continues to have increased frequency of stools. Due for surveillance colonoscopy in October, will schedule sooner given above symptoms.    Problem # 2:  DIARRHEA (ICD-787.91) Assessment: Unchanged Diarrhea, mainly morning time.  See #1.  Orders: Colonoscopy (Colon) TLB-Udip w/ Micro (81001-URINE)  Problem # 3:  GERD (ICD-530.81) Assessment: Unchanged  Patient Instructions: 1)  Please go to the lab, basement level. 2)  We have scheduled the Colonoscopy with Dr. Claudette Head for 03-25-09 t 11:30Am.  Directions provided. 3)  Colonoscopy and conscous sedation brochure provided. 4)  The Colon preperation has been sent electronically to your pharmacy. 5)  Copy sent to : Adela Lank, MD 6)  The medication list was reviewed and reconciled.  All changed / newly prescribed  medications were explained.  A complete medication list was provided to the patient / caregiver. Prescriptions: MOVIPREP 100 GM  SOLR (PEG-KCL-NACL-NASULF-NA ASC-C) As per prep instructions.  #1 x 0   Entered by:   Lowry Ram NCMA   Authorized by:   Willette Cluster NP   Signed by:   Lowry Ram NCMA on 02/25/2009   Method used:   Electronically to        [facility]  [address] [id number]* (retail)       [address]       [place], [place]  [address]       Ph: [phone] or [phone]       Fax: [phone]   RxID:   [id number]

## 2010-07-17 NOTE — Progress Notes (Signed)
Summary: COL ?'s   Phone Note Call from Patient Call back at [phone]   Caller: Patient Call For: Dr. Russella Dar Reason for Call: Talk to Nurse Summary of Call: pt had COL yesterday and has general questions about the procedure and results Initial call taken by: Vallarie Mare,  March 26, 2009 2:43 PM  Follow-up for Phone Call        RETURNED PHONE CALL AND ANSWERED QUESTIONS. CONCERN ABOUT A COLD STARTING AND WANT TO KNOW ABOUT TAKING MEDICATIONS. Follow-up by: Darlyn Read RN,  March 26, 2009 4:01 PM

## 2010-07-17 NOTE — Procedures (Signed)
Summary: Gastroenterology EGD  Gastroenterology EGD   Imported By: Thereasa Solo 11/04/2007 12:38:32  _____________________________________________________________________  External Attachment:    Type:   Image     Comment:   External Document

## 2010-07-17 NOTE — Procedures (Signed)
Summary: Colonoscopy  Patient: Nancy Webb Note: All result statuses are Final unless otherwise noted.  Tests: (1) Colonoscopy (COL)   COL Colonoscopy           DONE      [facility]      [address]      [place], [place]  [address]      ?      COLONOSCOPY PROCEDURE REPORT      ?      PATIENT:  Nancy Webb, Nancy Webb  MR#:  [id number]      BIRTHDATE:  1932/01/05, 77 yrs. old  GENDER:  female      ?      ENDOSCOPIST:  Venita Lick. Russella Dar, MD, [facility]      ?      PROCEDURE DATE:  03/25/2009      PROCEDURE:  Colonoscopy with biopsy and snare polypectomy      ASA CLASS:  Class II      INDICATIONS:  1) unexplained diarrhea  2) abdominal pain  3)      follow-up of polyp, adenomatous polyps, 1994.      ?      MEDICATIONS:   Fentanyl 75 mcg, Versed 7 mg IV      ?      DESCRIPTION OF PROCEDURE:   After the risks benefits and      alternatives of the procedure were thoroughly explained, informed      consent was obtained.  Digital rectal exam was performed and      revealed no abnormalities.   The LB [id number]-[id number] endoscope      was introduced through the anus and advanced to the terminal ileum      which was intubated for a short distance, without limitations.      The quality of the prep was good, using MoviPrep.  The instrument      was then slowly withdrawn as the colon was fully examined.      <<PROCEDUREIMAGES>>      ?      FINDINGS:  Two polyps were found in the proximal transverse colon.      They were 4 - 6 mm in size. Polyps were snared without cautery.      Retrieval was successful. Moderate diverticulosis was found      sigmoid to descending.  Remainder of colon normal. Random biopsies      were obtained and sent to pathology.  The terminal ileum appeared      normal.   Retroflexed views in the rectum revealed internal      hemorrhoids, small.  The time to cecum =  3  minutes. The scope      was then withdrawn (time =  10.75  min)  from the patient and the      procedure completed.      ?      COMPLICATIONS:  None      ?      ENDOSCOPIC IMPRESSION:      1) 4 - 6 mm Two polyps in the proximal transverse colon      2) Moderate diverticulosis in the sigmoid to descending      3) Internal hemorrhoids      ?      RECOMMENDATIONS:      1) Await pathology results      2) Repeat Colonoscopy in 5 years.      3) high fiber diet  4) continue treatment for IBS      ?      Venita Lick. Russella Dar, MD, [facility]      ?      CC: Adela Lank, MD      ?      n.      eSIGNEDVenita Lick. Sabin Gibeault at 03/25/2009 11:58 AM      ?      Nancy Webb, [id number]  Note: An exclamation mark (!) indicates a result that was not dispersed into the flowsheet. Document Creation Date: 03/27/2009 8:49 AM _______________________________________________________________________  (1) Order result status: Final Collection or observation date-time: 03/25/2009 11:30 Requested date-time:  Receipt date-time:  Reported date-time:  Referring Physician:   Ordering Physician: Claudette Head [phone]) Specimen Source:  Source: Launa Grill Order Number: [id number] Lab site:

## 2010-07-17 NOTE — Letter (Signed)
Summary: Patient Notice- Polyp Results  [facility]  [address] [place], [place] [address]   Phone: [phone]  Fax: [phone]        March 26, 2009 MRN: [id number]    Nancy Webb [address] [place], [place]  [address]    Dear [name],  I am pleased to inform you that the colon polyp(s) removed during your recent colonoscopy was (were) found to be benign (no cancer detected) upon pathologic examination.  The random colon biopsies were normal.  I recommend you have a repeat colonoscopy examination in 5 years to look for recurrent polyps, as having colon polyps increases your risk for having recurrent polyps or even colon cancer in the future.  Should you develop new or worsening symptoms of abdominal pain, bowel habit changes or bleeding from the rectum or bowels, please schedule an evaluation with either your primary care physician or with me.  Continue treatment plan as outlined the day of your exam.  Please call us if you are having persistent problems or have questions about your condition that have not been fully answered at this time.  Sincerely,  Meryl Dare MD [facility]  This letter has been electronically signed by your physician.  Appended Document: Patient Notice- Polyp Results letter sent 10.13.10.

## 2010-07-17 NOTE — Progress Notes (Signed)
   Phone Note Call from Patient   Caller: Patient Summary of Call: Pt had multiple questions about prep instructions, clear liquid diet and foods to avoid before colonoscopy on Monday.  All questions were answered.  Pt verbalized understanding. Initial call taken by: Jennye Boroughs RN,  March 22, 2009 2:43 PM

## 2010-10-28 NOTE — Assessment & Plan Note (Signed)
[facility]                         GASTROENTEROLOGY OFFICE NOTE   Nancy Webb, Nancy Webb                       MRN:          [id number]  DATE:11/29/2006                            DOB:          [birth year]    REASON FOR VISIT:  This is a return office visit for GERD.  Her reflux  symptoms are well-controlled on Protonix daily and antireflux measures.  She underwent endoscopy in October 2000, which showed erosive  esophagitis and sliding hiatal hernia.  She has no dysphagia,  odynophagia, abdominal pain, weight loss, change in bowel habits, melena  or hematochezia.  She has a history of adenomatous colon polyps,  diverticulosis and internal hemorrhoids.  She states that her husband  passed away in 08-25-22 of this year.  She feels she has been coping  fairly well with her loss.   CURRENT MEDICATIONS:  Current medications listed on the chart, updated  and reviewed.   ALLERGIES:  ALLEGRA.   PHYSICAL EXAMINATION:  GENERAL:  In no acute distress.  VITAL SIGNS:  Weight 137.2 pounds, blood pressure 142/78, pulse 56 and  regular.  CHEST:  Clear to auscultation bilaterally.  CARDIAC:  Regular rate and rhythm without murmurs.  ABDOMEN:  Soft, nontender with normoactive bowel sounds.   ASSESSMENT/PLAN:  1. Gastroesophageal reflux disease with a history of erosive      esophagitis.  Maintain standard antireflux measures.  Pantoprazole      40 mg p.o. q.a.m. on a daily basis.  Refills for 1 year prescribed.      Return office visit in 1 year.  2. Personal history of adenomatous colon polyps.  Recall colonoscopy      due in October 2010.     Venita Lick. Russella Dar, MD, [facility]  Electronically Signed    MTS/MedQ  DD: 11/29/2006  DT: 11/29/2006  Job #: [id number]

## 2010-10-31 NOTE — Assessment & Plan Note (Signed)
[facility]                             PULMONARY OFFICE NOTE   JOELLE, ROSWELL                       MRN:          [id number]  DATE:09/28/2006                            DOB:          05/03/1932    A 77 year old white female previously seen here more than 5 years ago  with problems with seasonal rhinitis and probable asthma.  She had been  controlled well on Singulair 10 mg daily with no need for rescue therapy  until 4 days ago when she began having hacking cough with minimum sputum  production.  She had already been placed on Protonix one daily with the  continuous instruction to increase to b.i.d. for any coughing, but has  not implemented the action plan yet because she forgot it.  She denies  any active itching, sneezing, but does have suggestive wheeze and cough  that bothers her nocturnally as well as daytime.  She also has a  generalized tick from coughing so hard.  However, she denies any  epistaxis, purulent sinus secretions, excess sputum production either  during the day or early in the morning, fevers, chills, sweats, chest  pain, or leg swelling.   PAST MEDICAL HISTORY:  Significant for diagnosis of asthma and allergies  as noted and seasonal rhinitis as noted above.  She also had a remote  breast biopsy.  She has documented GERD with esophagitis, see EGD January 29, 2000.  Hypertension and hyperlipidemia.   ALLERGIES:  No known drug allergies.   MEDICATIONS:  1. She is maintained on a baby aspirin daily.  2. Singulair 10 mg daily.  3. Ditropan.  4. Protonix once daily.  5. Benicar 20/12.5 one daily.  6. Lescol 80 one daily.   SOCIAL HISTORY:  She has never smoked.  She works in Audiological scientist.   FAMILY HISTORY:  Negative for respiratory diseases including atrophy and  asthma.   REVIEW OF SYSTEMS:  Taken in detail and negative as above.   PHYSICAL EXAMINATION:  GENERAL:  This is an anxious white female who  could not  answer a question in a straightforward fashion and bounced  from one topic to the other without really completing any thoughts.  VITAL SIGNS:  She had normal vital signs.  HEENT:  Unremarkable and mild. Oropharynx is clear.  NECK:  Supple without cervical adenopathy or tenderness.  Trachea  midline with no thyromegaly.  LUNGS:  Lung fields reveal pseudowheeze more than true wheeze  bilaterally.  HEART:  Regular rhythm without murmur, gallop, or rub.  ABDOMEN:  Soft and benign.  EXTREMITIES:  Warm without calf tenderness, cyanosis, or clubbing.   Chest x-ray is pending.   IMPRESSION:  1. This patient appears to have more pseudowheeze than true wheeze      indicating upper airway cough syndrome.  She is convinced she has a      sinus infection, but I am not.  I did recommend a Z-Pak because she      says this has worked in the past and a very short course of  prednisone at 40 mg to taper off over 6 days.   To control what is evolving to a cyclical cough, I recommended as before  increasing Protonix to b.i.d. dosing and using Tramadol 50 mg one q.4  hours p.r.n.   Should she not have complete elimination of her symptoms, she needs to  return because there may be a component of chronic asthma that needs to  be treated more aggressively.  However, based on her atypical aspect of  her symptoms, I suspect what she has is nothing more than nonspecific  rhinitis that triggered cough that triggered more reflux and  destabilized the upper airway on this basis.     Charlaine Dalton. Sherene Sires, MD, [facility]  Electronically Signed    MBW/MedQ  DD: 09/29/2006  DT: 09/29/2006  Job #: [id number]   cc:   Brooke Bonito, M.D.

## 2011-04-07 ENCOUNTER — Encounter: Payer: Self-pay | Admitting: Gastroenterology

## 2011-04-07 ENCOUNTER — Ambulatory Visit (INDEPENDENT_AMBULATORY_CARE_PROVIDER_SITE_OTHER): Payer: Medicare Other | Admitting: Gastroenterology

## 2011-04-07 VITALS — BP 154/90 | HR 80 | Ht 62.0 in | Wt 143.0 lb

## 2011-04-07 DIAGNOSIS — K219 Gastro-esophageal reflux disease without esophagitis: Secondary | ICD-10-CM

## 2011-04-07 DIAGNOSIS — K589 Irritable bowel syndrome without diarrhea: Secondary | ICD-10-CM

## 2011-04-07 MED ORDER — OMEPRAZOLE 40 MG PO CPDR: 40.0000 mg | DELAYED_RELEASE_CAPSULE | Freq: Every day | ORAL | Status: DC

## 2011-04-07 MED ORDER — HYOSCYAMINE SULFATE 0.125 MG SL SUBL: 0.1250 mg | SUBLINGUAL_TABLET | Freq: Four times a day (QID) | SUBLINGUAL | Status: DC | PRN

## 2011-04-07 NOTE — Patient Instructions (Addendum)
We have sent the following medications to your pharmacy for you to pick up at your convenience: Omeprazole and Hyoscyamine.  Please follow up with your primary care physician as planned and as needed with me.   cc: Adela Lank, MD

## 2011-04-07 NOTE — Progress Notes (Signed)
History of Present Illness: This is a 77 year old female with GERD that is well controlled on omeprazole 40 mg daily. She has persistent but mild irritable bowel syndrome symptoms. She has loose stools generally occurring in the morning. She has not had significant abdominal pain.  Current Medications, Allergies, Past Medical History, Past Surgical History, Family History and Social History were reviewed in [employer] record.  Physical Exam: General: Well developed , well nourished, no acute distress Head: Normocephalic and atraumatic Eyes:  sclerae anicteric, EOMI Ears: Normal auditory acuity Mouth: No deformity or lesions Lungs: Clear throughout to auscultation Heart: Regular rate and rhythm; no murmurs, rubs or bruits Abdomen: Soft, non tender and non distended. No masses, hepatosplenomegaly or hernias noted. Normal Bowel sounds Musculoskeletal: Symmetrical with no gross deformities  Pulses:  Normal pulses noted Extremities: No clubbing, cyanosis, edema or deformities noted Neurological: Alert oriented x 4, grossly nonfocal Psychological:  Alert and cooperative. Normal mood and affect  Assessment and Recommendations:  1. GERD. Continue standard and reflux measures and omeprazole 40 mg daily. This problem is mild and stable and she was advised to return to her primary physician for ongoing management. I will see back as needed.  2. Irritable bowel syndrome. Levsin SL 1-2 4 times a day when necessary abdominal pain and diarrhea. This problem is mild and stable and she was advised to return to her primary physician for ongoing management. I will see back as needed.  3. Personal history of adenomatous colon polyps originally diagnosed in 1994. Surveillance colonoscopy recommended October 2015.

## 2011-05-21 ENCOUNTER — Encounter (HOSPITAL_COMMUNITY): Payer: Self-pay | Admitting: Emergency Medicine

## 2011-05-21 ENCOUNTER — Emergency Department (HOSPITAL_COMMUNITY)
Admission: EM | Admit: 2011-05-21 | Discharge: 2011-05-21 | Disposition: A | Discharge status: Home / Self Care | Payer: Medicare Other | Attending: Emergency Medicine | Admitting: Emergency Medicine

## 2011-05-21 DIAGNOSIS — Z853 Personal history of malignant neoplasm of breast: Secondary | ICD-10-CM | POA: Insufficient documentation

## 2011-05-21 DIAGNOSIS — Z79899 Other long term (current) drug therapy: Secondary | ICD-10-CM | POA: Insufficient documentation

## 2011-05-21 DIAGNOSIS — J3489 Other specified disorders of nose and nasal sinuses: Secondary | ICD-10-CM | POA: Insufficient documentation

## 2011-05-21 DIAGNOSIS — J069 Acute upper respiratory infection, unspecified: Secondary | ICD-10-CM

## 2011-05-21 DIAGNOSIS — Z862 Personal history of diseases of the blood and blood-forming organs and certain disorders involving the immune mechanism: Secondary | ICD-10-CM | POA: Insufficient documentation

## 2011-05-21 DIAGNOSIS — J45909 Unspecified asthma, uncomplicated: Secondary | ICD-10-CM | POA: Insufficient documentation

## 2011-05-21 DIAGNOSIS — R110 Nausea: Secondary | ICD-10-CM

## 2011-05-21 DIAGNOSIS — E785 Hyperlipidemia, unspecified: Secondary | ICD-10-CM | POA: Insufficient documentation

## 2011-05-21 DIAGNOSIS — R197 Diarrhea, unspecified: Secondary | ICD-10-CM | POA: Insufficient documentation

## 2011-05-21 DIAGNOSIS — R6883 Chills (without fever): Secondary | ICD-10-CM | POA: Insufficient documentation

## 2011-05-21 DIAGNOSIS — Z8639 Personal history of other endocrine, nutritional and metabolic disease: Secondary | ICD-10-CM | POA: Insufficient documentation

## 2011-05-21 DIAGNOSIS — I10 Essential (primary) hypertension: Secondary | ICD-10-CM | POA: Insufficient documentation

## 2011-05-21 DIAGNOSIS — R059 Cough, unspecified: Secondary | ICD-10-CM | POA: Insufficient documentation

## 2011-05-21 DIAGNOSIS — R05 Cough: Secondary | ICD-10-CM | POA: Insufficient documentation

## 2011-05-21 DIAGNOSIS — M129 Arthropathy, unspecified: Secondary | ICD-10-CM | POA: Insufficient documentation

## 2011-05-21 DIAGNOSIS — R112 Nausea with vomiting, unspecified: Secondary | ICD-10-CM | POA: Insufficient documentation

## 2011-05-21 DIAGNOSIS — K589 Irritable bowel syndrome without diarrhea: Secondary | ICD-10-CM | POA: Insufficient documentation

## 2011-05-21 DIAGNOSIS — K219 Gastro-esophageal reflux disease without esophagitis: Secondary | ICD-10-CM | POA: Insufficient documentation

## 2011-05-21 MED ORDER — PROMETHAZINE HCL 25 MG PO TABS: 25.0000 mg | ORAL_TABLET | Freq: Four times a day (QID) | ORAL | Status: AC | PRN

## 2011-05-21 MED ORDER — AZITHROMYCIN 250 MG PO TABS: 250.0000 mg | ORAL_TABLET | Freq: Every day | ORAL | Status: AC

## 2011-05-21 MED ORDER — PROMETHAZINE HCL 25 MG PO TABS
25.0000 mg | ORAL_TABLET | ORAL | Status: AC
  Administered 2011-05-21: 25 mg via ORAL
  Filled 2011-05-21: qty 1

## 2011-05-21 NOTE — ED Notes (Signed)
Pt ambulated with a steady gait; VSS: no acute signs of distress; respirations even and unlabored; pt reports she will follow d.c instructions.

## 2011-05-21 NOTE — ED Notes (Signed)
PT. REPORTS NAUSEA WITH DIARRHEA , COUGHING AND SNEEZING ONSET YESTERDAY.

## 2011-05-21 NOTE — ED Provider Notes (Signed)
History     CSN: [id number] Arrival date & time: 05/21/2011  3:13 AM   First MD Initiated Contact with Patient 05/21/11 [phone]      Chief Complaint  Patient presents with  . Nausea    (Consider location/radiation/quality/duration/timing/severity/associated sxs/prior treatment) HPI Comments: Vomiting, diarrhea for 12 hours.  Also with cough, congestion, thinks has sinus infection.  Patient is a 77 y.o. female presenting with vomiting.  Emesis  This is a new problem. The current episode started yesterday. The problem has been gradually worsening. There has been no fever. Associated symptoms include chills and diarrhea. Pertinent negatives include no abdominal pain.    Past Medical History  Diagnosis Date  . Adenomatous colon polyp 02/1993  . Hyperlipidemia   . Hypertension   . Allergic rhinitis   . Asthma   . Hiatal hernia   . GERD (gastroesophageal reflux disease)   . Internal hemorrhoids   . Diverticulosis   . Breast cancer     Right breast  . Arthritis   . IBS (irritable bowel syndrome)   . Gout     Past Surgical History  Procedure Date  . Mastectomy 1987    Right  . Cystectomy     Right ovary  . Mastectomy     Family History  Problem Relation Age of Onset  . Breast cancer Maternal Aunt     x 2    History  Substance Use Topics  . Smoking status: Never Smoker   . Smokeless tobacco: Never Used  . Alcohol Use: Yes    OB History    Grav Para Term Preterm Abortions TAB SAB Ect Mult Living                  Review of Systems  Constitutional: Positive for chills.  Gastrointestinal: Positive for vomiting and diarrhea. Negative for abdominal pain.  All other systems reviewed and are negative.    Allergies  Loratadine  Home Medications   Current Outpatient Rx  Name Route Sig Dispense Refill  . HYOSCYAMINE SULFATE 0.125 MG SL SUBL Sublingual Place 1 tablet (0.125 mg total) under the tongue 4 (four) times daily as needed. 60 tablet 11  . MONTELUKAST  SODIUM 10 MG PO TABS Oral Take 10 mg by mouth at bedtime.      . OMEPRAZOLE 40 MG PO CPDR Oral Take 1 capsule (40 mg total) by mouth daily. 30 capsule 11  . OXYBUTYNIN CHLORIDE ER 10 MG PO TB24 Oral Take 10 mg by mouth daily.      Marland Kitchen ROSUVASTATIN CALCIUM 10 MG PO TABS Oral Take 10 mg by mouth daily.      Marland Kitchen VALSARTAN-HYDROCHLOROTHIAZIDE 160-25 MG PO TABS Oral Take 1 tablet by mouth daily.        BP 165/90  Pulse 77  Temp(Src) 98.5 F (36.9 C) (Oral)  Resp 20  SpO2 97%  Physical Exam  Nursing note and vitals reviewed. Constitutional: She is oriented to person, place, and time. She appears well-developed and well-nourished. No distress.  HENT:  Head: Normocephalic and atraumatic.  Neck: Normal range of motion. Neck supple.  Cardiovascular: Normal rate and regular rhythm.  Exam reveals no gallop and no friction rub.   No murmur heard. Pulmonary/Chest: Effort normal and breath sounds normal. No respiratory distress. She has no wheezes.  Abdominal: Soft. Bowel sounds are normal. She exhibits no distension. There is no tenderness.  Musculoskeletal: Normal range of motion.  Neurological: She is alert and oriented to person, place, and  time.  Skin: Skin is warm and dry. She is not diaphoretic.    ED Course  Procedures (including critical care time)  Labs Reviewed - No data to display No results found.   No diagnosis found.    MDM  Sounds viral.  Will prescribe phenergan and give prescription for zmax to fill if not improving in 2-3 days.        Geoffery Lyons, MD 05/21/11 [phone]

## 2011-07-09 DIAGNOSIS — H40019 Open angle with borderline findings, low risk, unspecified eye: Secondary | ICD-10-CM | POA: Diagnosis not present

## 2011-07-09 DIAGNOSIS — G909 Disorder of the autonomic nervous system, unspecified: Secondary | ICD-10-CM | POA: Diagnosis not present

## 2011-08-06 DIAGNOSIS — H40019 Open angle with borderline findings, low risk, unspecified eye: Secondary | ICD-10-CM | POA: Diagnosis not present

## 2011-08-06 DIAGNOSIS — H268 Other specified cataract: Secondary | ICD-10-CM | POA: Diagnosis not present

## 2011-08-10 DIAGNOSIS — E789 Disorder of lipoprotein metabolism, unspecified: Secondary | ICD-10-CM | POA: Diagnosis not present

## 2011-08-10 DIAGNOSIS — H6120 Impacted cerumen, unspecified ear: Secondary | ICD-10-CM | POA: Diagnosis not present

## 2011-08-10 DIAGNOSIS — I10 Essential (primary) hypertension: Secondary | ICD-10-CM | POA: Diagnosis not present

## 2011-08-25 DIAGNOSIS — D239 Other benign neoplasm of skin, unspecified: Secondary | ICD-10-CM | POA: Diagnosis not present

## 2011-08-25 DIAGNOSIS — D1801 Hemangioma of skin and subcutaneous tissue: Secondary | ICD-10-CM | POA: Diagnosis not present

## 2011-08-25 DIAGNOSIS — L821 Other seborrheic keratosis: Secondary | ICD-10-CM | POA: Diagnosis not present

## 2011-08-31 DIAGNOSIS — E789 Disorder of lipoprotein metabolism, unspecified: Secondary | ICD-10-CM | POA: Diagnosis not present

## 2011-08-31 DIAGNOSIS — I10 Essential (primary) hypertension: Secondary | ICD-10-CM | POA: Diagnosis not present

## 2011-08-31 DIAGNOSIS — H9209 Otalgia, unspecified ear: Secondary | ICD-10-CM | POA: Diagnosis not present

## 2011-09-04 DIAGNOSIS — H2510 Age-related nuclear cataract, unspecified eye: Secondary | ICD-10-CM | POA: Diagnosis not present

## 2011-09-04 DIAGNOSIS — H40019 Open angle with borderline findings, low risk, unspecified eye: Secondary | ICD-10-CM | POA: Diagnosis not present

## 2011-09-08 DIAGNOSIS — H6520 Chronic serous otitis media, unspecified ear: Secondary | ICD-10-CM | POA: Diagnosis not present

## 2011-09-08 DIAGNOSIS — H902 Conductive hearing loss, unspecified: Secondary | ICD-10-CM | POA: Diagnosis not present

## 2011-09-08 DIAGNOSIS — H6980 Other specified disorders of Eustachian tube, unspecified ear: Secondary | ICD-10-CM | POA: Diagnosis not present

## 2011-09-29 DIAGNOSIS — H6980 Other specified disorders of Eustachian tube, unspecified ear: Secondary | ICD-10-CM | POA: Diagnosis not present

## 2011-09-29 DIAGNOSIS — H902 Conductive hearing loss, unspecified: Secondary | ICD-10-CM | POA: Diagnosis not present

## 2011-10-08 DIAGNOSIS — Z1231 Encounter for screening mammogram for malignant neoplasm of breast: Secondary | ICD-10-CM | POA: Diagnosis not present

## 2011-10-13 DIAGNOSIS — H401490 Capsular glaucoma with pseudoexfoliation of lens, unspecified eye, stage unspecified: Secondary | ICD-10-CM | POA: Diagnosis not present

## 2011-10-13 DIAGNOSIS — H409 Unspecified glaucoma: Secondary | ICD-10-CM | POA: Diagnosis not present

## 2011-10-13 DIAGNOSIS — N390 Urinary tract infection, site not specified: Secondary | ICD-10-CM | POA: Diagnosis not present

## 2011-10-26 DIAGNOSIS — H6980 Other specified disorders of Eustachian tube, unspecified ear: Secondary | ICD-10-CM | POA: Diagnosis not present

## 2011-10-27 DIAGNOSIS — N390 Urinary tract infection, site not specified: Secondary | ICD-10-CM | POA: Diagnosis not present

## 2011-10-27 DIAGNOSIS — E789 Disorder of lipoprotein metabolism, unspecified: Secondary | ICD-10-CM | POA: Diagnosis not present

## 2011-10-27 DIAGNOSIS — I10 Essential (primary) hypertension: Secondary | ICD-10-CM | POA: Diagnosis not present

## 2011-11-03 DIAGNOSIS — I10 Essential (primary) hypertension: Secondary | ICD-10-CM | POA: Diagnosis not present

## 2011-11-03 DIAGNOSIS — M109 Gout, unspecified: Secondary | ICD-10-CM | POA: Diagnosis not present

## 2011-11-03 DIAGNOSIS — N390 Urinary tract infection, site not specified: Secondary | ICD-10-CM | POA: Diagnosis not present

## 2011-11-03 DIAGNOSIS — E789 Disorder of lipoprotein metabolism, unspecified: Secondary | ICD-10-CM | POA: Diagnosis not present

## 2011-12-08 ENCOUNTER — Other Ambulatory Visit: Payer: Self-pay | Admitting: Obstetrics & Gynecology

## 2011-12-08 DIAGNOSIS — Z124 Encounter for screening for malignant neoplasm of cervix: Secondary | ICD-10-CM | POA: Diagnosis not present

## 2012-01-26 DIAGNOSIS — H6980 Other specified disorders of Eustachian tube, unspecified ear: Secondary | ICD-10-CM | POA: Diagnosis not present

## 2012-02-22 DIAGNOSIS — H409 Unspecified glaucoma: Secondary | ICD-10-CM | POA: Diagnosis not present

## 2012-02-22 DIAGNOSIS — H401490 Capsular glaucoma with pseudoexfoliation of lens, unspecified eye, stage unspecified: Secondary | ICD-10-CM | POA: Diagnosis not present

## 2012-03-01 DIAGNOSIS — L820 Inflamed seborrheic keratosis: Secondary | ICD-10-CM | POA: Diagnosis not present

## 2012-03-01 DIAGNOSIS — D1801 Hemangioma of skin and subcutaneous tissue: Secondary | ICD-10-CM | POA: Diagnosis not present

## 2012-03-01 DIAGNOSIS — D230 Other benign neoplasm of skin of lip: Secondary | ICD-10-CM | POA: Diagnosis not present

## 2012-03-01 DIAGNOSIS — D235 Other benign neoplasm of skin of trunk: Secondary | ICD-10-CM | POA: Diagnosis not present

## 2012-03-01 DIAGNOSIS — L578 Other skin changes due to chronic exposure to nonionizing radiation: Secondary | ICD-10-CM | POA: Diagnosis not present

## 2012-03-01 DIAGNOSIS — D2310 Other benign neoplasm of skin of unspecified eyelid, including canthus: Secondary | ICD-10-CM | POA: Diagnosis not present

## 2012-03-01 DIAGNOSIS — L570 Actinic keratosis: Secondary | ICD-10-CM | POA: Diagnosis not present

## 2012-03-01 DIAGNOSIS — L819 Disorder of pigmentation, unspecified: Secondary | ICD-10-CM | POA: Diagnosis not present

## 2012-04-07 ENCOUNTER — Ambulatory Visit (INDEPENDENT_AMBULATORY_CARE_PROVIDER_SITE_OTHER): Payer: Medicare Other | Admitting: Gastroenterology

## 2012-04-07 ENCOUNTER — Encounter: Payer: Self-pay | Admitting: Gastroenterology

## 2012-04-07 VITALS — BP 144/92 | HR 68 | Ht 61.25 in | Wt 142.0 lb

## 2012-04-07 DIAGNOSIS — K219 Gastro-esophageal reflux disease without esophagitis: Secondary | ICD-10-CM

## 2012-04-07 DIAGNOSIS — K589 Irritable bowel syndrome without diarrhea: Secondary | ICD-10-CM | POA: Diagnosis not present

## 2012-04-07 MED ORDER — OMEPRAZOLE 40 MG PO CPDR: 40.0000 mg | DELAYED_RELEASE_CAPSULE | Freq: Every day | ORAL | Status: DC

## 2012-04-07 MED ORDER — HYOSCYAMINE SULFATE 0.125 MG SL SUBL: 0.1250 mg | SUBLINGUAL_TABLET | Freq: Four times a day (QID) | SUBLINGUAL | Status: DC | PRN

## 2012-04-07 NOTE — Patient Instructions (Addendum)
We have sent the following medications to your pharmacy for you to pick up at your convenience: Levsin, Omeprazole.

## 2012-04-07 NOTE — Progress Notes (Signed)
History of Present Illness: This is a 78 year old female with chronic GERD and irritable bowel syndrome. Her reflux symptoms are under excellent control. She has occasional episodes of abdominal bloating and discomfort associated with loose bowel movements. Hyoscyamine as effective in controlling the symptoms however she is reluctant to take this medication. She has a complete physical examination with blood work scheduled with Dr. Juleen China within the next few weeks.  Review of Systems: Pertinent positive and negative review of systems were noted in the above HPI section. All other review of systems were otherwise negative.  Current Medications, Allergies, Past Medical History, Past Surgical History, Family History and Social History were reviewed in [employer] record.  Physical Exam: General: Well developed , well nourished, no acute distress Head: Normocephalic and atraumatic Eyes:  sclerae anicteric, EOMI Ears: Normal auditory acuity Mouth: No deformity or lesions Neck: Supple, no masses or thyromegaly Lungs: Clear throughout to auscultation Heart: Regular rate and rhythm; no murmurs, rubs or bruits Abdomen: Soft, non tender and non distended. No masses, hepatosplenomegaly or hernias noted. Normal Bowel sounds Musculoskeletal: Symmetrical with no gross deformities  Skin: No lesions on visible extremities Pulses:  Normal pulses noted Extremities: No clubbing, cyanosis, edema or deformities noted Neurological: Alert oriented x 4, grossly nonfocal Cervical Nodes:  No significant cervical adenopathy Inguinal Nodes: No significant inguinal adenopathy Psychological:  Alert and cooperative. Normal mood and affect  Assessment and Recommendations:  1. GERD. Continue antireflux measures and omeprazole 40 mg daily. Refill omeprazole. This problem is chronic and stable. She is advised to return to her PCP for ongoing followup and refills.  2. Irritable bowel syndrome. This  problem is chronic and stable. Symptoms responded well to hyoscyamine. She is encouraged to use hyoscyamine more frequently. Refill omeprazole. May use Imodium twice a day when necessary for diarrhea. This problem is stable and she is advised to return to her PCP for ongoing followup and refills.  3. Personal history of adenomatous colon polyps. Surveillance colonoscopy recommended October 2015.

## 2012-04-28 DIAGNOSIS — E789 Disorder of lipoprotein metabolism, unspecified: Secondary | ICD-10-CM | POA: Diagnosis not present

## 2012-04-28 DIAGNOSIS — R82998 Other abnormal findings in urine: Secondary | ICD-10-CM | POA: Diagnosis not present

## 2012-05-05 DIAGNOSIS — E789 Disorder of lipoprotein metabolism, unspecified: Secondary | ICD-10-CM | POA: Diagnosis not present

## 2012-05-05 DIAGNOSIS — M109 Gout, unspecified: Secondary | ICD-10-CM | POA: Diagnosis not present

## 2012-05-05 DIAGNOSIS — N318 Other neuromuscular dysfunction of bladder: Secondary | ICD-10-CM | POA: Diagnosis not present

## 2012-06-28 DIAGNOSIS — H401490 Capsular glaucoma with pseudoexfoliation of lens, unspecified eye, stage unspecified: Secondary | ICD-10-CM | POA: Diagnosis not present

## 2012-06-28 DIAGNOSIS — H409 Unspecified glaucoma: Secondary | ICD-10-CM | POA: Diagnosis not present

## 2012-07-11 DIAGNOSIS — I10 Essential (primary) hypertension: Secondary | ICD-10-CM | POA: Diagnosis not present

## 2012-07-11 DIAGNOSIS — M79609 Pain in unspecified limb: Secondary | ICD-10-CM | POA: Diagnosis not present

## 2012-07-11 DIAGNOSIS — E789 Disorder of lipoprotein metabolism, unspecified: Secondary | ICD-10-CM | POA: Diagnosis not present

## 2012-07-15 DIAGNOSIS — H6980 Other specified disorders of Eustachian tube, unspecified ear: Secondary | ICD-10-CM | POA: Diagnosis not present

## 2012-07-15 DIAGNOSIS — H6120 Impacted cerumen, unspecified ear: Secondary | ICD-10-CM | POA: Diagnosis not present

## 2012-07-25 DIAGNOSIS — H2510 Age-related nuclear cataract, unspecified eye: Secondary | ICD-10-CM | POA: Diagnosis not present

## 2012-08-02 DIAGNOSIS — R209 Unspecified disturbances of skin sensation: Secondary | ICD-10-CM | POA: Diagnosis not present

## 2012-08-02 DIAGNOSIS — G909 Disorder of the autonomic nervous system, unspecified: Secondary | ICD-10-CM | POA: Diagnosis not present

## 2012-08-08 DIAGNOSIS — H2510 Age-related nuclear cataract, unspecified eye: Secondary | ICD-10-CM | POA: Diagnosis not present

## 2012-08-16 DIAGNOSIS — E789 Disorder of lipoprotein metabolism, unspecified: Secondary | ICD-10-CM | POA: Diagnosis not present

## 2012-08-18 DIAGNOSIS — I6529 Occlusion and stenosis of unspecified carotid artery: Secondary | ICD-10-CM | POA: Diagnosis not present

## 2012-08-23 DIAGNOSIS — M109 Gout, unspecified: Secondary | ICD-10-CM | POA: Diagnosis not present

## 2012-08-23 DIAGNOSIS — I10 Essential (primary) hypertension: Secondary | ICD-10-CM | POA: Diagnosis not present

## 2012-08-23 DIAGNOSIS — E789 Disorder of lipoprotein metabolism, unspecified: Secondary | ICD-10-CM | POA: Diagnosis not present

## 2012-08-24 DIAGNOSIS — H2589 Other age-related cataract: Secondary | ICD-10-CM | POA: Diagnosis not present

## 2012-08-24 DIAGNOSIS — H269 Unspecified cataract: Secondary | ICD-10-CM | POA: Diagnosis not present

## 2012-08-24 DIAGNOSIS — H2510 Age-related nuclear cataract, unspecified eye: Secondary | ICD-10-CM | POA: Diagnosis not present

## 2012-10-07 DIAGNOSIS — H2510 Age-related nuclear cataract, unspecified eye: Secondary | ICD-10-CM | POA: Diagnosis not present

## 2012-10-18 DIAGNOSIS — D239 Other benign neoplasm of skin, unspecified: Secondary | ICD-10-CM | POA: Diagnosis not present

## 2012-10-18 DIAGNOSIS — L819 Disorder of pigmentation, unspecified: Secondary | ICD-10-CM | POA: Diagnosis not present

## 2012-10-18 DIAGNOSIS — L821 Other seborrheic keratosis: Secondary | ICD-10-CM | POA: Diagnosis not present

## 2012-10-18 DIAGNOSIS — D1801 Hemangioma of skin and subcutaneous tissue: Secondary | ICD-10-CM | POA: Diagnosis not present

## 2012-10-18 DIAGNOSIS — Z1231 Encounter for screening mammogram for malignant neoplasm of breast: Secondary | ICD-10-CM | POA: Diagnosis not present

## 2012-10-18 DIAGNOSIS — D234 Other benign neoplasm of skin of scalp and neck: Secondary | ICD-10-CM | POA: Diagnosis not present

## 2012-10-18 DIAGNOSIS — L820 Inflamed seborrheic keratosis: Secondary | ICD-10-CM | POA: Diagnosis not present

## 2012-10-20 DIAGNOSIS — N6009 Solitary cyst of unspecified breast: Secondary | ICD-10-CM | POA: Diagnosis not present

## 2012-10-26 DIAGNOSIS — H269 Unspecified cataract: Secondary | ICD-10-CM | POA: Diagnosis not present

## 2012-10-26 DIAGNOSIS — H2510 Age-related nuclear cataract, unspecified eye: Secondary | ICD-10-CM | POA: Diagnosis not present

## 2012-10-26 DIAGNOSIS — H2589 Other age-related cataract: Secondary | ICD-10-CM | POA: Diagnosis not present

## 2012-11-01 DIAGNOSIS — Z79899 Other long term (current) drug therapy: Secondary | ICD-10-CM | POA: Diagnosis not present

## 2012-11-01 DIAGNOSIS — N390 Urinary tract infection, site not specified: Secondary | ICD-10-CM | POA: Diagnosis not present

## 2012-11-01 DIAGNOSIS — E789 Disorder of lipoprotein metabolism, unspecified: Secondary | ICD-10-CM | POA: Diagnosis not present

## 2012-11-01 DIAGNOSIS — I10 Essential (primary) hypertension: Secondary | ICD-10-CM | POA: Diagnosis not present

## 2012-11-08 DIAGNOSIS — R0609 Other forms of dyspnea: Secondary | ICD-10-CM | POA: Diagnosis not present

## 2012-11-08 DIAGNOSIS — N390 Urinary tract infection, site not specified: Secondary | ICD-10-CM | POA: Diagnosis not present

## 2012-11-08 DIAGNOSIS — R0989 Other specified symptoms and signs involving the circulatory and respiratory systems: Secondary | ICD-10-CM | POA: Diagnosis not present

## 2012-11-08 DIAGNOSIS — E789 Disorder of lipoprotein metabolism, unspecified: Secondary | ICD-10-CM | POA: Diagnosis not present

## 2012-11-08 DIAGNOSIS — I10 Essential (primary) hypertension: Secondary | ICD-10-CM | POA: Diagnosis not present

## 2012-11-23 DIAGNOSIS — H6980 Other specified disorders of Eustachian tube, unspecified ear: Secondary | ICD-10-CM | POA: Diagnosis not present

## 2012-12-06 DIAGNOSIS — N390 Urinary tract infection, site not specified: Secondary | ICD-10-CM | POA: Diagnosis not present

## 2012-12-13 DIAGNOSIS — N318 Other neuromuscular dysfunction of bladder: Secondary | ICD-10-CM | POA: Diagnosis not present

## 2012-12-13 DIAGNOSIS — N951 Menopausal and female climacteric states: Secondary | ICD-10-CM | POA: Diagnosis not present

## 2013-03-14 DIAGNOSIS — E789 Disorder of lipoprotein metabolism, unspecified: Secondary | ICD-10-CM | POA: Diagnosis not present

## 2013-03-14 DIAGNOSIS — M109 Gout, unspecified: Secondary | ICD-10-CM | POA: Diagnosis not present

## 2013-03-21 DIAGNOSIS — M109 Gout, unspecified: Secondary | ICD-10-CM | POA: Diagnosis not present

## 2013-03-21 DIAGNOSIS — E789 Disorder of lipoprotein metabolism, unspecified: Secondary | ICD-10-CM | POA: Diagnosis not present

## 2013-03-21 DIAGNOSIS — I10 Essential (primary) hypertension: Secondary | ICD-10-CM | POA: Diagnosis not present

## 2013-04-18 DIAGNOSIS — Z853 Personal history of malignant neoplasm of breast: Secondary | ICD-10-CM | POA: Diagnosis not present

## 2013-04-20 ENCOUNTER — Ambulatory Visit (INDEPENDENT_AMBULATORY_CARE_PROVIDER_SITE_OTHER): Payer: Medicare Other | Admitting: Gastroenterology

## 2013-04-20 ENCOUNTER — Encounter: Payer: Self-pay | Admitting: Gastroenterology

## 2013-04-20 VITALS — BP 102/70 | HR 76 | Ht 61.0 in | Wt 149.5 lb

## 2013-04-20 DIAGNOSIS — R141 Gas pain: Secondary | ICD-10-CM

## 2013-04-20 DIAGNOSIS — K219 Gastro-esophageal reflux disease without esophagitis: Secondary | ICD-10-CM | POA: Diagnosis not present

## 2013-04-20 DIAGNOSIS — K589 Irritable bowel syndrome without diarrhea: Secondary | ICD-10-CM

## 2013-04-20 MED ORDER — OMEPRAZOLE 40 MG PO CPDR: 40.0000 mg | DELAYED_RELEASE_CAPSULE | Freq: Every day | ORAL | Status: DC

## 2013-04-20 MED ORDER — HYOSCYAMINE SULFATE 0.125 MG SL SUBL: 0.1250 mg | SUBLINGUAL_TABLET | Freq: Four times a day (QID) | SUBLINGUAL | Status: AC | PRN

## 2013-04-20 NOTE — Patient Instructions (Signed)
We have sent the following medications to your pharmacy for you to pick up at your convenience: Omeprazole and Levsin.   You can take Gas-X four times a day as needed for gas and bloating.   Return to Dr. Juleen China for ongoing care and follow up with Dr. Russella Dar as needed for IBS symptoms.   cc: Darci Needle, MD

## 2013-04-20 NOTE — Progress Notes (Signed)
    History of Present Illness: This is a 79 year old female with a staple GERD and irritable bowel syndrome who returns for refills. She'll be due for surveillance colonoscopy in one year. She sees Dr. Juleen China about every 4 months.  Current Medications, Allergies, Past Medical History, Past Surgical History, Family History and Social History were reviewed in [employer] record.  Physical Exam: General: Well developed , well nourished, no acute distress Head: Normocephalic and atraumatic Eyes:  sclerae anicteric, EOMI Ears: Normal auditory acuity Mouth: No deformity or lesions Lungs: Clear throughout to auscultation Heart: Regular rate and rhythm; no murmurs, rubs or bruits Abdomen: Soft, non tender and non distended. No masses, hepatosplenomegaly or hernias noted. Normal Bowel sounds Musculoskeletal: Symmetrical with no gross deformities  Pulses:  Normal pulses noted Extremities: No clubbing, cyanosis, edema or deformities noted Neurological: Alert oriented x 4, grossly nonfocal Psychological:  Alert and cooperative. Normal mood and affect  Assessment and Recommendations:  1. GERD. Continue antireflux measures and omeprazole 40 mg daily. Refill omeprazole. This problem is chronic and stable. She is advised to return to her PCP for ongoing followup and refills.   2. Irritable bowel syndrome. This problem is chronic and stable. Symptoms responded well to hyoscyamine. She is encouraged to use hyoscyamine more frequently. Refill omeprazole. May use Imodium twice a day when necessary for diarrhea. May use Gas-X 4 times a day as needed for gas. IBS is stable and she is advised to return to her PCP for ongoing followup and refills.   3. Personal history of adenomatous colon polyps. Consider surveillance colonoscopy in October 2015.

## 2013-04-25 DIAGNOSIS — L723 Sebaceous cyst: Secondary | ICD-10-CM | POA: Diagnosis not present

## 2013-04-25 DIAGNOSIS — D2310 Other benign neoplasm of skin of unspecified eyelid, including canthus: Secondary | ICD-10-CM | POA: Diagnosis not present

## 2013-04-25 DIAGNOSIS — D239 Other benign neoplasm of skin, unspecified: Secondary | ICD-10-CM | POA: Diagnosis not present

## 2013-04-25 DIAGNOSIS — D219 Benign neoplasm of connective and other soft tissue, unspecified: Secondary | ICD-10-CM | POA: Diagnosis not present

## 2013-04-25 DIAGNOSIS — L821 Other seborrheic keratosis: Secondary | ICD-10-CM | POA: Diagnosis not present

## 2013-05-08 ENCOUNTER — Other Ambulatory Visit: Payer: Self-pay

## 2013-05-08 DIAGNOSIS — H401490 Capsular glaucoma with pseudoexfoliation of lens, unspecified eye, stage unspecified: Secondary | ICD-10-CM | POA: Diagnosis not present

## 2013-05-08 DIAGNOSIS — H353290 Exudative age-related macular degeneration, unspecified eye, stage unspecified: Secondary | ICD-10-CM | POA: Diagnosis not present

## 2013-05-08 DIAGNOSIS — K589 Irritable bowel syndrome without diarrhea: Secondary | ICD-10-CM

## 2013-05-08 DIAGNOSIS — K219 Gastro-esophageal reflux disease without esophagitis: Secondary | ICD-10-CM

## 2013-05-08 MED ORDER — OMEPRAZOLE 40 MG PO CPDR: 40.0000 mg | DELAYED_RELEASE_CAPSULE | Freq: Every day | ORAL | Status: DC

## 2013-05-08 NOTE — Telephone Encounter (Signed)
Error

## 2013-06-01 DIAGNOSIS — H60509 Unspecified acute noninfective otitis externa, unspecified ear: Secondary | ICD-10-CM | POA: Diagnosis not present

## 2013-06-01 DIAGNOSIS — H6120 Impacted cerumen, unspecified ear: Secondary | ICD-10-CM | POA: Diagnosis not present

## 2013-07-18 DIAGNOSIS — E789 Disorder of lipoprotein metabolism, unspecified: Secondary | ICD-10-CM | POA: Diagnosis not present

## 2013-07-25 DIAGNOSIS — R82998 Other abnormal findings in urine: Secondary | ICD-10-CM | POA: Diagnosis not present

## 2013-07-25 DIAGNOSIS — I10 Essential (primary) hypertension: Secondary | ICD-10-CM | POA: Diagnosis not present

## 2013-07-25 DIAGNOSIS — E789 Disorder of lipoprotein metabolism, unspecified: Secondary | ICD-10-CM | POA: Diagnosis not present

## 2013-08-07 DIAGNOSIS — I10 Essential (primary) hypertension: Secondary | ICD-10-CM | POA: Diagnosis not present

## 2013-08-07 DIAGNOSIS — E789 Disorder of lipoprotein metabolism, unspecified: Secondary | ICD-10-CM | POA: Diagnosis not present

## 2013-08-07 DIAGNOSIS — M79609 Pain in unspecified limb: Secondary | ICD-10-CM | POA: Diagnosis not present

## 2013-08-07 DIAGNOSIS — M19039 Primary osteoarthritis, unspecified wrist: Secondary | ICD-10-CM | POA: Diagnosis not present

## 2013-08-25 DIAGNOSIS — I10 Essential (primary) hypertension: Secondary | ICD-10-CM | POA: Diagnosis not present

## 2013-08-25 DIAGNOSIS — L0390 Cellulitis, unspecified: Secondary | ICD-10-CM | POA: Diagnosis not present

## 2013-08-25 DIAGNOSIS — L0291 Cutaneous abscess, unspecified: Secondary | ICD-10-CM | POA: Diagnosis not present

## 2013-09-05 DIAGNOSIS — M109 Gout, unspecified: Secondary | ICD-10-CM | POA: Diagnosis not present

## 2013-09-07 DIAGNOSIS — M79609 Pain in unspecified limb: Secondary | ICD-10-CM | POA: Diagnosis not present

## 2013-09-07 DIAGNOSIS — I10 Essential (primary) hypertension: Secondary | ICD-10-CM | POA: Diagnosis not present

## 2013-09-07 DIAGNOSIS — E789 Disorder of lipoprotein metabolism, unspecified: Secondary | ICD-10-CM | POA: Diagnosis not present

## 2013-09-25 DIAGNOSIS — L0291 Cutaneous abscess, unspecified: Secondary | ICD-10-CM | POA: Diagnosis not present

## 2013-09-25 DIAGNOSIS — L0390 Cellulitis, unspecified: Secondary | ICD-10-CM | POA: Diagnosis not present

## 2013-09-27 DIAGNOSIS — L0291 Cutaneous abscess, unspecified: Secondary | ICD-10-CM | POA: Diagnosis not present

## 2013-10-02 DIAGNOSIS — I803 Phlebitis and thrombophlebitis of lower extremities, unspecified: Secondary | ICD-10-CM | POA: Diagnosis not present

## 2013-10-03 ENCOUNTER — Other Ambulatory Visit: Payer: Self-pay | Admitting: *Deleted

## 2013-10-03 DIAGNOSIS — I8310 Varicose veins of unspecified lower extremity with inflammation: Secondary | ICD-10-CM

## 2013-10-04 ENCOUNTER — Encounter: Payer: Self-pay | Admitting: Vascular Surgery

## 2013-10-05 ENCOUNTER — Encounter: Payer: Self-pay | Admitting: Vascular Surgery

## 2013-10-06 ENCOUNTER — Ambulatory Visit (INDEPENDENT_AMBULATORY_CARE_PROVIDER_SITE_OTHER): Payer: Medicare Other | Admitting: Vascular Surgery

## 2013-10-06 ENCOUNTER — Ambulatory Visit (HOSPITAL_COMMUNITY)
Admission: RE | Admit: 2013-10-06 | Discharge: 2013-10-06 | Disposition: A | Discharge status: Home / Self Care | Payer: Medicare Other | Source: Ambulatory Visit | Attending: Vascular Surgery | Admitting: Vascular Surgery

## 2013-10-06 ENCOUNTER — Other Ambulatory Visit: Payer: Self-pay | Admitting: Vascular Surgery

## 2013-10-06 ENCOUNTER — Encounter: Payer: Self-pay | Admitting: Vascular Surgery

## 2013-10-06 VITALS — BP 128/83 | HR 73 | Resp 16 | Ht 62.0 in | Wt 147.0 lb

## 2013-10-06 DIAGNOSIS — I83893 Varicose veins of bilateral lower extremities with other complications: Secondary | ICD-10-CM | POA: Diagnosis not present

## 2013-10-06 DIAGNOSIS — I8000 Phlebitis and thrombophlebitis of superficial vessels of unspecified lower extremity: Secondary | ICD-10-CM

## 2013-10-06 DIAGNOSIS — I8310 Varicose veins of unspecified lower extremity with inflammation: Secondary | ICD-10-CM

## 2013-10-06 DIAGNOSIS — I8001 Phlebitis and thrombophlebitis of superficial vessels of right lower extremity: Secondary | ICD-10-CM

## 2013-10-06 NOTE — Progress Notes (Signed)
Referred by:  Anda Kraft, MD [address] [place], [place] [address]  Reason for referral: Swollen right leg  History of Present Illness  Nancy Webb is a 80 y.o. (09/04/1931) female who presents with chief complaint: swollen leg.  Patient notes, onset of swelling 1 months ago, associated with a history of reoccurring throbophlebitis.  The patient's symptoms include: Right leg pain, swelling , erythema and tenderness.  The patient has had no history of DVT, no recent history of pregnancy, a history of varicose vein, no history of venous stasis ulcers, no history of  Lymphedema and a history of skin changes in lower legs.  There is a family history of venous disorders.  The patient has  used thigh high compression stockings for years. She is taking ibuprofen and keflex for the past 10 days.    Past Medical History  Diagnosis Date  . Adenomatous colon polyp 02/1993  . Hyperlipidemia   . Hypertension   . Allergic rhinitis   . Asthma   . Hiatal hernia   . GERD (gastroesophageal reflux disease)   . Internal hemorrhoids   . Diverticulosis   . Breast cancer     Right breast  . Arthritis   . IBS (irritable bowel syndrome)   . Gout   . Varicose veins     superificial thrombophlebitis right leg  09-2013    Past Surgical History  Procedure Laterality Date  . Mastectomy Right 1987  . Cystectomy Right     ovary  . Mastectomy    . Cataract extraction, bilateral  08/2012, 09/2012    History   Social History  . Marital Status: Widowed    Spouse Name: N/A    Number of Children: 2  . Years of Education: N/A   Occupational History  . Accounting    Social History Main Topics  . Smoking status: Never Smoker   . Smokeless tobacco: Never Used  . Alcohol Use: Yes  . Drug Use: No  . Sexual Activity: Not on file   Other Topics Concern  . Not on file   Social History Narrative  . No narrative on file    Family History  Problem Relation Age of Onset  .  Breast cancer Maternal Aunt     x 2      Current Outpatient Prescriptions on File Prior to Visit  Medication Sig Dispense Refill  . amLODipine (NORVASC) 5 MG tablet Take 5 mg by mouth daily.       Marland Kitchen aspirin 81 MG tablet Take 81 mg by mouth daily.      . cephALEXin (KEFLEX) 500 MG capsule Take 500 mg by mouth 4 (four) times daily.      . hyoscyamine (LEVSIN SL) 0.125 MG SL tablet Place 1 tablet (0.125 mg total) under the tongue 4 (four) times daily as needed.  100 tablet  11  . ibuprofen (ADVIL,MOTRIN) 400 MG tablet Take 400 mg by mouth 3 (three) times daily.      . irbesartan (AVAPRO) 300 MG tablet Take 300 mg by mouth daily.       . montelukast (SINGULAIR) 10 MG tablet Take 10 mg by mouth daily.       Marland Kitchen omeprazole (PRILOSEC) 40 MG capsule Take 1 capsule (40 mg total) by mouth daily.  30 capsule  11  . rosuvastatin (CRESTOR) 10 MG tablet Take 10 mg by mouth daily.        . VESICARE 5 MG tablet Take 2.5  mg by mouth daily.        No current facility-administered medications on file prior to visit.    Allergies  Allergen Reactions  . Claritin [Loratadine]     REACTION: headache      REVIEW OF SYSTEMS:  (Positives checked otherwise negative)  CARDIOVASCULAR:  []  chest pain, []  chest pressure, []  palpitations, []  shortness of breath when laying flat, []  shortness of breath with exertion,  []  pain in feet when walking, []  pain in feet when laying flat, []  history of blood clot in veins (DVT), [x]  history of phlebitis, []  swelling in legs, [x]  varicose veins  PULMONARY:  []  productive cough, []  asthma, []  wheezing  NEUROLOGIC:  []  weakness in arms or legs, []  numbness in arms or legs, []  difficulty speaking or slurred speech, []  temporary loss of vision in one eye, []  dizziness  HEMATOLOGIC:  []  bleeding problems, []  problems with blood clotting too easily  MUSCULOSKEL:  []  joint pain, []  joint swelling  GASTROINTEST:  []  vomiting blood, []  blood in stool     GENITOURINARY:  []   burning with urination, []  blood in urine  PSYCHIATRIC:  []  history of major depression  INTEGUMENTARY:  []  rashes, []  ulcers  CONSTITUTIONAL:  []  fever, []  chills   Physical Examination Filed Vitals:   10/06/13 1432  BP: 128/83  Pulse: 73  Resp: 16  Height: 5\' 2"  (1.575 m)  Weight: 147 lb (66.679 kg)   Body mass index is 26.88 kg/(m^2).  General: A&O x 3, WDWN  Head: [place]  Ear/Nose/Throat: Hearing grossly intact, nares w/o erythema or drainageEyes: PERRLA, , Post surg chg to pupils / Unable to coop w/ exam  Neck: Supple, no nuchal rigidity  Pulmonary: Sym exp, good air movt, CTAB,   Cardiac: RRR Vascular: Vessel Right Left  Radial Palpable Palpable  Brachial Palpable Palpable  Carotid Palpable, without bruit Palpable, without bruit  Aorta Not palpable N/A  Femoral Palpable Palpable  Popliteal Not palpable Not palpable  PT Palpable Palpable  DP Palpable Palpable   Gastrointestinal: soft, NTND Musculoskeletal: M/S 5/5 throughout, Extremities without ischemic changes   Neurologic: CN 2-12 intact    Psychiatric: Judgment intact  Dermatologic: See M/S exam for extremity exam, no rashes otherwise noted  Lymph : No Cervical, Axillary, or Inguinal lymphadenopathy   Non-Invasive Vascular Imaging  RLE Venous Duplex (Date: 10/06/2013):   This is no evidence of DVT. She does have reflux throughout her great saphenous vein on the right and this does extend into the large varicosities in her medial calf with thrombus present.   Medical Decision Making  Patient has a superficial femoral phlebitis and a large varicosities plexus in her right medial calf. She is fortunately having some resolution. She has undergone one course of Keflex and I've asked her to discontinue this is a do not see any evidence of infection. She is to continue her ibuprofen and I did explain the significance of the anti-inflammatory response and also that she would probably have ongoing symptoms for  at least several weeks to several months until she has resolution of the inflammation. She is allowed to return to usual activities. She is questioning horseback riding and ice skating and I feel this is appropriate for either of these. I did explain the possible treatment option of ablation of her great saphenous vein should she continue to have difficulty after she has resolution of the thrombophlebitis. We will see her again in 2 months for continued followup. She has been  extremely compliant in her graduated compression garments. She wears a thigh high and has worn pain to style in the past. We will see her again in 2 months for further followup [facility]: [phone]   10/06/2013, 3:06 PM

## 2013-10-10 ENCOUNTER — Encounter: Payer: Medicare Other | Admitting: Vascular Surgery

## 2013-10-10 ENCOUNTER — Encounter (HOSPITAL_COMMUNITY): Payer: Medicare Other

## 2013-10-11 DIAGNOSIS — G5620 Lesion of ulnar nerve, unspecified upper limb: Secondary | ICD-10-CM | POA: Diagnosis not present

## 2013-10-24 DIAGNOSIS — N6009 Solitary cyst of unspecified breast: Secondary | ICD-10-CM | POA: Diagnosis not present

## 2013-11-02 DIAGNOSIS — E559 Vitamin D deficiency, unspecified: Secondary | ICD-10-CM | POA: Diagnosis not present

## 2013-11-02 DIAGNOSIS — E2839 Other primary ovarian failure: Secondary | ICD-10-CM | POA: Diagnosis not present

## 2013-11-02 DIAGNOSIS — E789 Disorder of lipoprotein metabolism, unspecified: Secondary | ICD-10-CM | POA: Diagnosis not present

## 2013-11-02 DIAGNOSIS — I10 Essential (primary) hypertension: Secondary | ICD-10-CM | POA: Diagnosis not present

## 2013-11-02 DIAGNOSIS — Z79899 Other long term (current) drug therapy: Secondary | ICD-10-CM | POA: Diagnosis not present

## 2013-11-09 DIAGNOSIS — E789 Disorder of lipoprotein metabolism, unspecified: Secondary | ICD-10-CM | POA: Diagnosis not present

## 2013-11-09 DIAGNOSIS — K219 Gastro-esophageal reflux disease without esophagitis: Secondary | ICD-10-CM | POA: Diagnosis not present

## 2013-11-09 DIAGNOSIS — I803 Phlebitis and thrombophlebitis of lower extremities, unspecified: Secondary | ICD-10-CM | POA: Diagnosis not present

## 2013-11-09 DIAGNOSIS — I10 Essential (primary) hypertension: Secondary | ICD-10-CM | POA: Diagnosis not present

## 2013-11-21 DIAGNOSIS — D3130 Benign neoplasm of unspecified choroid: Secondary | ICD-10-CM | POA: Diagnosis not present

## 2013-11-21 DIAGNOSIS — H353290 Exudative age-related macular degeneration, unspecified eye, stage unspecified: Secondary | ICD-10-CM | POA: Diagnosis not present

## 2013-11-21 DIAGNOSIS — H26499 Other secondary cataract, unspecified eye: Secondary | ICD-10-CM | POA: Diagnosis not present

## 2013-12-04 ENCOUNTER — Encounter: Payer: Self-pay | Admitting: Vascular Surgery

## 2013-12-05 ENCOUNTER — Ambulatory Visit (INDEPENDENT_AMBULATORY_CARE_PROVIDER_SITE_OTHER): Payer: Medicare Other | Admitting: Vascular Surgery

## 2013-12-05 ENCOUNTER — Encounter: Payer: Self-pay | Admitting: Vascular Surgery

## 2013-12-05 VITALS — BP 148/95 | HR 76 | Resp 18 | Ht 62.0 in | Wt 150.0 lb

## 2013-12-05 DIAGNOSIS — I8310 Varicose veins of unspecified lower extremity with inflammation: Secondary | ICD-10-CM

## 2013-12-05 DIAGNOSIS — I8311 Varicose veins of right lower extremity with inflammation: Secondary | ICD-10-CM

## 2013-12-05 NOTE — Progress Notes (Signed)
Problems with Activities of Daily Living Secondary to Leg Pain  1. Mrs. Nancy Webb states that any activities that require prolonged standing (cooking, shopping, cleaning) are difficult due to leg pain.  2. Mrs. Nancy Webb states that she has stopped horse back riding and ice skating due to leg pain and swelling.       Failure of  Conservative Therapy:  1. Worn 20-30 mm Hg thigh high compression hose >3 months with no relief of symptoms.  2. Frequently elevates legs-no relief of symptoms  3. Taken Ibuprofen 600 Mg TID with no relief of symptoms.  The patient presents today for continued concern regarding her venous hypertension her right leg. She is a very delightful active 80 year old female. She had an episode of superficial thrombophlebitis in her medial proximal calf. She has had resolution to a great degree of this. She reports some mild discomfort with prolonged standing. Extremely compliant with her compression garment.  Physical exam she does have some thickening in the area of her prior superficial thrombophlebitis. She does have scattered telangiectasia diffusely.  A long discussion with the patient. She does have superficial venous reflux in her right great saphenous vein and explained if she could have improvement in her venous hypertension with treatment of this. She is certainly able to tolerate her current level of discomfort with compression and elevation. I recommend that we continue this. She is a candidate for treatment we would reserve this for worsening symptoms. She is comfortable with this decision will notify should she have any problems in the future.

## 2014-01-01 ENCOUNTER — Other Ambulatory Visit: Payer: Self-pay | Admitting: Obstetrics & Gynecology

## 2014-01-01 DIAGNOSIS — N318 Other neuromuscular dysfunction of bladder: Secondary | ICD-10-CM | POA: Diagnosis not present

## 2014-01-01 DIAGNOSIS — Z01419 Encounter for gynecological examination (general) (routine) without abnormal findings: Secondary | ICD-10-CM | POA: Diagnosis not present

## 2014-01-01 DIAGNOSIS — Z124 Encounter for screening for malignant neoplasm of cervix: Secondary | ICD-10-CM | POA: Diagnosis not present

## 2014-01-02 LAB — CYTOLOGY - PAP

## 2014-02-08 DIAGNOSIS — E559 Vitamin D deficiency, unspecified: Secondary | ICD-10-CM | POA: Diagnosis not present

## 2014-02-09 ENCOUNTER — Encounter: Payer: Self-pay | Admitting: Gastroenterology

## 2014-03-01 DIAGNOSIS — E789 Disorder of lipoprotein metabolism, unspecified: Secondary | ICD-10-CM | POA: Diagnosis not present

## 2014-03-06 ENCOUNTER — Other Ambulatory Visit: Payer: Self-pay | Admitting: Dermatology

## 2014-03-06 DIAGNOSIS — Z85828 Personal history of other malignant neoplasm of skin: Secondary | ICD-10-CM | POA: Diagnosis not present

## 2014-03-06 DIAGNOSIS — L723 Sebaceous cyst: Secondary | ICD-10-CM | POA: Diagnosis not present

## 2014-03-06 DIAGNOSIS — D692 Other nonthrombocytopenic purpura: Secondary | ICD-10-CM | POA: Diagnosis not present

## 2014-03-06 DIAGNOSIS — L821 Other seborrheic keratosis: Secondary | ICD-10-CM | POA: Diagnosis not present

## 2014-03-06 DIAGNOSIS — D219 Benign neoplasm of connective and other soft tissue, unspecified: Secondary | ICD-10-CM | POA: Diagnosis not present

## 2014-03-06 DIAGNOSIS — L739 Follicular disorder, unspecified: Secondary | ICD-10-CM | POA: Diagnosis not present

## 2014-03-06 DIAGNOSIS — D2330 Other benign neoplasm of skin of unspecified part of face: Secondary | ICD-10-CM | POA: Diagnosis not present

## 2014-03-06 DIAGNOSIS — D235 Other benign neoplasm of skin of trunk: Secondary | ICD-10-CM | POA: Diagnosis not present

## 2014-03-09 DIAGNOSIS — E789 Disorder of lipoprotein metabolism, unspecified: Secondary | ICD-10-CM | POA: Diagnosis not present

## 2014-03-09 DIAGNOSIS — I10 Essential (primary) hypertension: Secondary | ICD-10-CM | POA: Diagnosis not present

## 2014-03-09 DIAGNOSIS — N318 Other neuromuscular dysfunction of bladder: Secondary | ICD-10-CM | POA: Diagnosis not present

## 2014-06-01 DIAGNOSIS — H401412 Capsular glaucoma with pseudoexfoliation of lens, right eye, moderate stage: Secondary | ICD-10-CM | POA: Diagnosis not present

## 2014-06-01 DIAGNOSIS — H04123 Dry eye syndrome of bilateral lacrimal glands: Secondary | ICD-10-CM | POA: Diagnosis not present

## 2014-06-19 DIAGNOSIS — H401411 Capsular glaucoma with pseudoexfoliation of lens, right eye, mild stage: Secondary | ICD-10-CM | POA: Diagnosis not present

## 2014-07-03 DIAGNOSIS — H401431 Capsular glaucoma with pseudoexfoliation of lens, bilateral, mild stage: Secondary | ICD-10-CM | POA: Diagnosis not present

## 2014-07-12 DIAGNOSIS — E789 Disorder of lipoprotein metabolism, unspecified: Secondary | ICD-10-CM | POA: Diagnosis not present

## 2014-07-19 DIAGNOSIS — Z79899 Other long term (current) drug therapy: Secondary | ICD-10-CM | POA: Diagnosis not present

## 2014-07-19 DIAGNOSIS — I10 Essential (primary) hypertension: Secondary | ICD-10-CM | POA: Diagnosis not present

## 2014-07-19 DIAGNOSIS — E789 Disorder of lipoprotein metabolism, unspecified: Secondary | ICD-10-CM | POA: Diagnosis not present

## 2014-08-16 ENCOUNTER — Encounter: Payer: Self-pay | Admitting: Gastroenterology

## 2014-09-14 DIAGNOSIS — H40012 Open angle with borderline findings, low risk, left eye: Secondary | ICD-10-CM | POA: Diagnosis not present

## 2014-09-14 DIAGNOSIS — H04123 Dry eye syndrome of bilateral lacrimal glands: Secondary | ICD-10-CM | POA: Diagnosis not present

## 2014-10-30 DIAGNOSIS — Z1231 Encounter for screening mammogram for malignant neoplasm of breast: Secondary | ICD-10-CM | POA: Diagnosis not present

## 2014-10-31 ENCOUNTER — Encounter: Payer: Self-pay | Admitting: Gastroenterology

## 2014-11-08 DIAGNOSIS — N390 Urinary tract infection, site not specified: Secondary | ICD-10-CM | POA: Diagnosis not present

## 2014-11-08 DIAGNOSIS — E789 Disorder of lipoprotein metabolism, unspecified: Secondary | ICD-10-CM | POA: Diagnosis not present

## 2014-11-08 DIAGNOSIS — I10 Essential (primary) hypertension: Secondary | ICD-10-CM | POA: Diagnosis not present

## 2014-11-08 DIAGNOSIS — E559 Vitamin D deficiency, unspecified: Secondary | ICD-10-CM | POA: Diagnosis not present

## 2014-11-08 DIAGNOSIS — Z79899 Other long term (current) drug therapy: Secondary | ICD-10-CM | POA: Diagnosis not present

## 2014-11-15 DIAGNOSIS — N3281 Overactive bladder: Secondary | ICD-10-CM | POA: Diagnosis not present

## 2014-11-15 DIAGNOSIS — E789 Disorder of lipoprotein metabolism, unspecified: Secondary | ICD-10-CM | POA: Diagnosis not present

## 2014-11-15 DIAGNOSIS — I10 Essential (primary) hypertension: Secondary | ICD-10-CM | POA: Diagnosis not present

## 2014-11-15 DIAGNOSIS — N390 Urinary tract infection, site not specified: Secondary | ICD-10-CM | POA: Diagnosis not present

## 2015-01-15 DIAGNOSIS — H04123 Dry eye syndrome of bilateral lacrimal glands: Secondary | ICD-10-CM | POA: Diagnosis not present

## 2015-01-15 DIAGNOSIS — H40012 Open angle with borderline findings, low risk, left eye: Secondary | ICD-10-CM | POA: Diagnosis not present

## 2015-01-15 DIAGNOSIS — D3132 Benign neoplasm of left choroid: Secondary | ICD-10-CM | POA: Diagnosis not present

## 2015-01-15 DIAGNOSIS — H401411 Capsular glaucoma with pseudoexfoliation of lens, right eye, mild stage: Secondary | ICD-10-CM | POA: Diagnosis not present

## 2015-03-07 DIAGNOSIS — D1801 Hemangioma of skin and subcutaneous tissue: Secondary | ICD-10-CM | POA: Diagnosis not present

## 2015-03-07 DIAGNOSIS — L814 Other melanin hyperpigmentation: Secondary | ICD-10-CM | POA: Diagnosis not present

## 2015-03-07 DIAGNOSIS — D225 Melanocytic nevi of trunk: Secondary | ICD-10-CM | POA: Diagnosis not present

## 2015-03-07 DIAGNOSIS — L720 Epidermal cyst: Secondary | ICD-10-CM | POA: Diagnosis not present

## 2015-03-07 DIAGNOSIS — D3612 Benign neoplasm of peripheral nerves and autonomic nervous system, upper limb, including shoulder: Secondary | ICD-10-CM | POA: Diagnosis not present

## 2015-03-07 DIAGNOSIS — Z85828 Personal history of other malignant neoplasm of skin: Secondary | ICD-10-CM | POA: Diagnosis not present

## 2015-03-07 DIAGNOSIS — L570 Actinic keratosis: Secondary | ICD-10-CM | POA: Diagnosis not present

## 2015-03-07 DIAGNOSIS — D692 Other nonthrombocytopenic purpura: Secondary | ICD-10-CM | POA: Diagnosis not present

## 2015-03-07 DIAGNOSIS — L821 Other seborrheic keratosis: Secondary | ICD-10-CM | POA: Diagnosis not present

## 2015-03-12 DIAGNOSIS — E559 Vitamin D deficiency, unspecified: Secondary | ICD-10-CM | POA: Diagnosis not present

## 2015-03-12 DIAGNOSIS — E789 Disorder of lipoprotein metabolism, unspecified: Secondary | ICD-10-CM | POA: Diagnosis not present

## 2015-03-12 DIAGNOSIS — I10 Essential (primary) hypertension: Secondary | ICD-10-CM | POA: Diagnosis not present

## 2015-03-19 DIAGNOSIS — I10 Essential (primary) hypertension: Secondary | ICD-10-CM | POA: Diagnosis not present

## 2015-03-19 DIAGNOSIS — E789 Disorder of lipoprotein metabolism, unspecified: Secondary | ICD-10-CM | POA: Diagnosis not present

## 2015-03-19 DIAGNOSIS — N390 Urinary tract infection, site not specified: Secondary | ICD-10-CM | POA: Diagnosis not present

## 2015-03-19 DIAGNOSIS — N3281 Overactive bladder: Secondary | ICD-10-CM | POA: Diagnosis not present

## 2015-05-24 DIAGNOSIS — H40012 Open angle with borderline findings, low risk, left eye: Secondary | ICD-10-CM | POA: Diagnosis not present

## 2015-05-24 DIAGNOSIS — H401411 Capsular glaucoma with pseudoexfoliation of lens, right eye, mild stage: Secondary | ICD-10-CM | POA: Diagnosis not present

## 2015-07-16 DIAGNOSIS — I10 Essential (primary) hypertension: Secondary | ICD-10-CM | POA: Diagnosis not present

## 2015-07-16 DIAGNOSIS — E789 Disorder of lipoprotein metabolism, unspecified: Secondary | ICD-10-CM | POA: Diagnosis not present

## 2015-07-16 DIAGNOSIS — N390 Urinary tract infection, site not specified: Secondary | ICD-10-CM | POA: Diagnosis not present

## 2015-07-16 DIAGNOSIS — N3281 Overactive bladder: Secondary | ICD-10-CM | POA: Diagnosis not present

## 2015-07-23 DIAGNOSIS — N390 Urinary tract infection, site not specified: Secondary | ICD-10-CM | POA: Diagnosis not present

## 2015-07-23 DIAGNOSIS — N3281 Overactive bladder: Secondary | ICD-10-CM | POA: Diagnosis not present

## 2015-07-23 DIAGNOSIS — I10 Essential (primary) hypertension: Secondary | ICD-10-CM | POA: Diagnosis not present

## 2015-10-04 DIAGNOSIS — M10032 Idiopathic gout, left wrist: Secondary | ICD-10-CM | POA: Diagnosis not present

## 2015-10-07 DIAGNOSIS — M10032 Idiopathic gout, left wrist: Secondary | ICD-10-CM | POA: Diagnosis not present

## 2015-10-18 DIAGNOSIS — M10032 Idiopathic gout, left wrist: Secondary | ICD-10-CM | POA: Diagnosis not present

## 2015-11-05 DIAGNOSIS — Z853 Personal history of malignant neoplasm of breast: Secondary | ICD-10-CM | POA: Diagnosis not present

## 2015-11-05 DIAGNOSIS — Z1231 Encounter for screening mammogram for malignant neoplasm of breast: Secondary | ICD-10-CM | POA: Diagnosis not present

## 2015-11-21 DIAGNOSIS — Z0000 Encounter for general adult medical examination without abnormal findings: Secondary | ICD-10-CM | POA: Diagnosis not present

## 2015-11-21 DIAGNOSIS — E789 Disorder of lipoprotein metabolism, unspecified: Secondary | ICD-10-CM | POA: Diagnosis not present

## 2015-11-21 DIAGNOSIS — I10 Essential (primary) hypertension: Secondary | ICD-10-CM | POA: Diagnosis not present

## 2015-11-21 DIAGNOSIS — E559 Vitamin D deficiency, unspecified: Secondary | ICD-10-CM | POA: Diagnosis not present

## 2015-11-21 DIAGNOSIS — Z79899 Other long term (current) drug therapy: Secondary | ICD-10-CM | POA: Diagnosis not present

## 2015-11-21 DIAGNOSIS — N390 Urinary tract infection, site not specified: Secondary | ICD-10-CM | POA: Diagnosis not present

## 2015-11-22 DIAGNOSIS — M10032 Idiopathic gout, left wrist: Secondary | ICD-10-CM | POA: Diagnosis not present

## 2015-11-26 DIAGNOSIS — H04123 Dry eye syndrome of bilateral lacrimal glands: Secondary | ICD-10-CM | POA: Diagnosis not present

## 2015-11-26 DIAGNOSIS — H52223 Regular astigmatism, bilateral: Secondary | ICD-10-CM | POA: Diagnosis not present

## 2015-11-26 DIAGNOSIS — H401411 Capsular glaucoma with pseudoexfoliation of lens, right eye, mild stage: Secondary | ICD-10-CM | POA: Diagnosis not present

## 2015-11-26 DIAGNOSIS — D3132 Benign neoplasm of left choroid: Secondary | ICD-10-CM | POA: Diagnosis not present

## 2015-11-26 DIAGNOSIS — H40012 Open angle with borderline findings, low risk, left eye: Secondary | ICD-10-CM | POA: Diagnosis not present

## 2015-11-26 DIAGNOSIS — H524 Presbyopia: Secondary | ICD-10-CM | POA: Diagnosis not present

## 2015-11-28 DIAGNOSIS — E559 Vitamin D deficiency, unspecified: Secondary | ICD-10-CM | POA: Diagnosis not present

## 2015-11-28 DIAGNOSIS — E789 Disorder of lipoprotein metabolism, unspecified: Secondary | ICD-10-CM | POA: Diagnosis not present

## 2015-11-28 DIAGNOSIS — I10 Essential (primary) hypertension: Secondary | ICD-10-CM | POA: Diagnosis not present

## 2015-12-03 DIAGNOSIS — H6120 Impacted cerumen, unspecified ear: Secondary | ICD-10-CM | POA: Diagnosis not present

## 2016-03-26 DIAGNOSIS — N390 Urinary tract infection, site not specified: Secondary | ICD-10-CM | POA: Diagnosis not present

## 2016-03-26 DIAGNOSIS — I10 Essential (primary) hypertension: Secondary | ICD-10-CM | POA: Diagnosis not present

## 2016-03-26 DIAGNOSIS — N3281 Overactive bladder: Secondary | ICD-10-CM | POA: Diagnosis not present

## 2016-03-26 DIAGNOSIS — E789 Disorder of lipoprotein metabolism, unspecified: Secondary | ICD-10-CM | POA: Diagnosis not present

## 2016-03-26 DIAGNOSIS — E559 Vitamin D deficiency, unspecified: Secondary | ICD-10-CM | POA: Diagnosis not present

## 2016-03-31 DIAGNOSIS — D692 Other nonthrombocytopenic purpura: Secondary | ICD-10-CM | POA: Diagnosis not present

## 2016-03-31 DIAGNOSIS — D2239 Melanocytic nevi of other parts of face: Secondary | ICD-10-CM | POA: Diagnosis not present

## 2016-03-31 DIAGNOSIS — L814 Other melanin hyperpigmentation: Secondary | ICD-10-CM | POA: Diagnosis not present

## 2016-03-31 DIAGNOSIS — L821 Other seborrheic keratosis: Secondary | ICD-10-CM | POA: Diagnosis not present

## 2016-03-31 DIAGNOSIS — D3612 Benign neoplasm of peripheral nerves and autonomic nervous system, upper limb, including shoulder: Secondary | ICD-10-CM | POA: Diagnosis not present

## 2016-03-31 DIAGNOSIS — Z85828 Personal history of other malignant neoplasm of skin: Secondary | ICD-10-CM | POA: Diagnosis not present

## 2016-03-31 DIAGNOSIS — D1801 Hemangioma of skin and subcutaneous tissue: Secondary | ICD-10-CM | POA: Diagnosis not present

## 2016-03-31 DIAGNOSIS — D225 Melanocytic nevi of trunk: Secondary | ICD-10-CM | POA: Diagnosis not present

## 2016-04-02 DIAGNOSIS — M542 Cervicalgia: Secondary | ICD-10-CM | POA: Diagnosis not present

## 2016-04-02 DIAGNOSIS — E789 Disorder of lipoprotein metabolism, unspecified: Secondary | ICD-10-CM | POA: Diagnosis not present

## 2016-04-02 DIAGNOSIS — N390 Urinary tract infection, site not specified: Secondary | ICD-10-CM | POA: Diagnosis not present

## 2016-05-29 DIAGNOSIS — H40012 Open angle with borderline findings, low risk, left eye: Secondary | ICD-10-CM | POA: Diagnosis not present

## 2016-05-29 DIAGNOSIS — H04123 Dry eye syndrome of bilateral lacrimal glands: Secondary | ICD-10-CM | POA: Diagnosis not present

## 2016-05-29 DIAGNOSIS — H401411 Capsular glaucoma with pseudoexfoliation of lens, right eye, mild stage: Secondary | ICD-10-CM | POA: Diagnosis not present

## 2016-08-25 DIAGNOSIS — E789 Disorder of lipoprotein metabolism, unspecified: Secondary | ICD-10-CM | POA: Diagnosis not present

## 2016-09-01 DIAGNOSIS — N3281 Overactive bladder: Secondary | ICD-10-CM | POA: Diagnosis not present

## 2016-09-01 DIAGNOSIS — E789 Disorder of lipoprotein metabolism, unspecified: Secondary | ICD-10-CM | POA: Diagnosis not present

## 2016-09-01 DIAGNOSIS — I10 Essential (primary) hypertension: Secondary | ICD-10-CM | POA: Diagnosis not present

## 2016-09-23 DIAGNOSIS — J029 Acute pharyngitis, unspecified: Secondary | ICD-10-CM | POA: Diagnosis not present

## 2016-09-23 DIAGNOSIS — R05 Cough: Secondary | ICD-10-CM | POA: Diagnosis not present

## 2016-11-03 DIAGNOSIS — Z1231 Encounter for screening mammogram for malignant neoplasm of breast: Secondary | ICD-10-CM | POA: Diagnosis not present

## 2016-11-03 DIAGNOSIS — Z853 Personal history of malignant neoplasm of breast: Secondary | ICD-10-CM | POA: Diagnosis not present

## 2016-11-24 DIAGNOSIS — H40012 Open angle with borderline findings, low risk, left eye: Secondary | ICD-10-CM | POA: Diagnosis not present

## 2016-11-24 DIAGNOSIS — H52223 Regular astigmatism, bilateral: Secondary | ICD-10-CM | POA: Diagnosis not present

## 2016-11-24 DIAGNOSIS — H524 Presbyopia: Secondary | ICD-10-CM | POA: Diagnosis not present

## 2016-11-24 DIAGNOSIS — H04123 Dry eye syndrome of bilateral lacrimal glands: Secondary | ICD-10-CM | POA: Diagnosis not present

## 2016-11-24 DIAGNOSIS — D3132 Benign neoplasm of left choroid: Secondary | ICD-10-CM | POA: Diagnosis not present

## 2016-11-24 DIAGNOSIS — H401411 Capsular glaucoma with pseudoexfoliation of lens, right eye, mild stage: Secondary | ICD-10-CM | POA: Diagnosis not present

## 2016-11-26 DIAGNOSIS — N390 Urinary tract infection, site not specified: Secondary | ICD-10-CM | POA: Diagnosis not present

## 2016-11-26 DIAGNOSIS — E559 Vitamin D deficiency, unspecified: Secondary | ICD-10-CM | POA: Diagnosis not present

## 2016-11-26 DIAGNOSIS — E789 Disorder of lipoprotein metabolism, unspecified: Secondary | ICD-10-CM | POA: Diagnosis not present

## 2016-11-26 DIAGNOSIS — M810 Age-related osteoporosis without current pathological fracture: Secondary | ICD-10-CM | POA: Diagnosis not present

## 2016-11-26 DIAGNOSIS — I10 Essential (primary) hypertension: Secondary | ICD-10-CM | POA: Diagnosis not present

## 2016-12-03 DIAGNOSIS — E789 Disorder of lipoprotein metabolism, unspecified: Secondary | ICD-10-CM | POA: Diagnosis not present

## 2016-12-03 DIAGNOSIS — I10 Essential (primary) hypertension: Secondary | ICD-10-CM | POA: Diagnosis not present

## 2016-12-03 DIAGNOSIS — N3281 Overactive bladder: Secondary | ICD-10-CM | POA: Diagnosis not present

## 2017-01-12 DIAGNOSIS — N959 Unspecified menopausal and perimenopausal disorder: Secondary | ICD-10-CM | POA: Diagnosis not present

## 2017-01-12 DIAGNOSIS — Z853 Personal history of malignant neoplasm of breast: Secondary | ICD-10-CM | POA: Diagnosis not present

## 2017-01-12 DIAGNOSIS — N3281 Overactive bladder: Secondary | ICD-10-CM | POA: Diagnosis not present

## 2017-01-12 DIAGNOSIS — Z6825 Body mass index (BMI) 25.0-25.9, adult: Secondary | ICD-10-CM | POA: Diagnosis not present

## 2017-01-15 DIAGNOSIS — R739 Hyperglycemia, unspecified: Secondary | ICD-10-CM | POA: Diagnosis not present

## 2017-01-15 DIAGNOSIS — K580 Irritable bowel syndrome with diarrhea: Secondary | ICD-10-CM | POA: Diagnosis not present

## 2017-01-15 DIAGNOSIS — E7800 Pure hypercholesterolemia, unspecified: Secondary | ICD-10-CM | POA: Diagnosis not present

## 2017-01-15 DIAGNOSIS — I10 Essential (primary) hypertension: Secondary | ICD-10-CM | POA: Diagnosis not present

## 2017-04-05 DIAGNOSIS — H04123 Dry eye syndrome of bilateral lacrimal glands: Secondary | ICD-10-CM | POA: Diagnosis not present

## 2017-04-05 DIAGNOSIS — D3132 Benign neoplasm of left choroid: Secondary | ICD-10-CM | POA: Diagnosis not present

## 2017-04-05 DIAGNOSIS — H401411 Capsular glaucoma with pseudoexfoliation of lens, right eye, mild stage: Secondary | ICD-10-CM | POA: Diagnosis not present

## 2017-04-05 DIAGNOSIS — H40012 Open angle with borderline findings, low risk, left eye: Secondary | ICD-10-CM | POA: Diagnosis not present

## 2017-04-06 DIAGNOSIS — D3617 Benign neoplasm of peripheral nerves and autonomic nervous system of trunk, unspecified: Secondary | ICD-10-CM | POA: Diagnosis not present

## 2017-04-06 DIAGNOSIS — D225 Melanocytic nevi of trunk: Secondary | ICD-10-CM | POA: Diagnosis not present

## 2017-04-06 DIAGNOSIS — D3612 Benign neoplasm of peripheral nerves and autonomic nervous system, upper limb, including shoulder: Secondary | ICD-10-CM | POA: Diagnosis not present

## 2017-04-06 DIAGNOSIS — D1801 Hemangioma of skin and subcutaneous tissue: Secondary | ICD-10-CM | POA: Diagnosis not present

## 2017-04-06 DIAGNOSIS — Z85828 Personal history of other malignant neoplasm of skin: Secondary | ICD-10-CM | POA: Diagnosis not present

## 2017-04-06 DIAGNOSIS — L570 Actinic keratosis: Secondary | ICD-10-CM | POA: Diagnosis not present

## 2017-04-06 DIAGNOSIS — L814 Other melanin hyperpigmentation: Secondary | ICD-10-CM | POA: Diagnosis not present

## 2017-04-06 DIAGNOSIS — L821 Other seborrheic keratosis: Secondary | ICD-10-CM | POA: Diagnosis not present

## 2017-05-10 DIAGNOSIS — I10 Essential (primary) hypertension: Secondary | ICD-10-CM | POA: Diagnosis not present

## 2017-05-10 DIAGNOSIS — R739 Hyperglycemia, unspecified: Secondary | ICD-10-CM | POA: Diagnosis not present

## 2017-05-13 DIAGNOSIS — I10 Essential (primary) hypertension: Secondary | ICD-10-CM | POA: Diagnosis not present

## 2017-05-13 DIAGNOSIS — E039 Hypothyroidism, unspecified: Secondary | ICD-10-CM | POA: Diagnosis not present

## 2017-05-13 DIAGNOSIS — K7689 Other specified diseases of liver: Secondary | ICD-10-CM | POA: Diagnosis not present

## 2017-05-13 DIAGNOSIS — E789 Disorder of lipoprotein metabolism, unspecified: Secondary | ICD-10-CM | POA: Diagnosis not present

## 2017-05-13 DIAGNOSIS — E559 Vitamin D deficiency, unspecified: Secondary | ICD-10-CM | POA: Diagnosis not present

## 2017-05-20 ENCOUNTER — Encounter: Payer: Self-pay | Admitting: Gastroenterology

## 2017-05-20 DIAGNOSIS — K7689 Other specified diseases of liver: Secondary | ICD-10-CM | POA: Diagnosis not present

## 2017-05-20 DIAGNOSIS — K8020 Calculus of gallbladder without cholecystitis without obstruction: Secondary | ICD-10-CM | POA: Insufficient documentation

## 2017-06-22 DIAGNOSIS — K7689 Other specified diseases of liver: Secondary | ICD-10-CM | POA: Diagnosis not present

## 2017-06-22 DIAGNOSIS — R5383 Other fatigue: Secondary | ICD-10-CM | POA: Diagnosis not present

## 2017-06-22 DIAGNOSIS — E039 Hypothyroidism, unspecified: Secondary | ICD-10-CM | POA: Diagnosis not present

## 2017-06-28 DIAGNOSIS — K7689 Other specified diseases of liver: Secondary | ICD-10-CM | POA: Diagnosis not present

## 2017-07-27 ENCOUNTER — Encounter: Payer: Self-pay | Admitting: Gastroenterology

## 2017-07-27 DIAGNOSIS — K7689 Other specified diseases of liver: Secondary | ICD-10-CM | POA: Diagnosis not present

## 2017-08-06 DIAGNOSIS — M25512 Pain in left shoulder: Secondary | ICD-10-CM | POA: Diagnosis not present

## 2017-08-06 DIAGNOSIS — E7800 Pure hypercholesterolemia, unspecified: Secondary | ICD-10-CM | POA: Diagnosis not present

## 2017-08-06 DIAGNOSIS — G8929 Other chronic pain: Secondary | ICD-10-CM | POA: Diagnosis not present

## 2017-08-06 DIAGNOSIS — K7689 Other specified diseases of liver: Secondary | ICD-10-CM | POA: Diagnosis not present

## 2017-08-06 DIAGNOSIS — E039 Hypothyroidism, unspecified: Secondary | ICD-10-CM | POA: Diagnosis not present

## 2017-08-06 DIAGNOSIS — M545 Low back pain: Secondary | ICD-10-CM | POA: Diagnosis not present

## 2017-08-10 DIAGNOSIS — H401411 Capsular glaucoma with pseudoexfoliation of lens, right eye, mild stage: Secondary | ICD-10-CM | POA: Diagnosis not present

## 2017-08-10 DIAGNOSIS — D3132 Benign neoplasm of left choroid: Secondary | ICD-10-CM | POA: Diagnosis not present

## 2017-08-10 DIAGNOSIS — H26492 Other secondary cataract, left eye: Secondary | ICD-10-CM | POA: Diagnosis not present

## 2017-08-10 DIAGNOSIS — H40012 Open angle with borderline findings, low risk, left eye: Secondary | ICD-10-CM | POA: Diagnosis not present

## 2017-08-10 DIAGNOSIS — H04123 Dry eye syndrome of bilateral lacrimal glands: Secondary | ICD-10-CM | POA: Diagnosis not present

## 2017-08-10 DIAGNOSIS — H5319 Other subjective visual disturbances: Secondary | ICD-10-CM | POA: Diagnosis not present

## 2017-08-16 DIAGNOSIS — M25512 Pain in left shoulder: Secondary | ICD-10-CM | POA: Diagnosis not present

## 2017-08-16 DIAGNOSIS — M7582 Other shoulder lesions, left shoulder: Secondary | ICD-10-CM | POA: Diagnosis not present

## 2017-08-16 DIAGNOSIS — M1990 Unspecified osteoarthritis, unspecified site: Secondary | ICD-10-CM | POA: Diagnosis not present

## 2017-08-16 DIAGNOSIS — M5116 Intervertebral disc disorders with radiculopathy, lumbar region: Secondary | ICD-10-CM | POA: Diagnosis not present

## 2017-08-16 DIAGNOSIS — M545 Low back pain: Secondary | ICD-10-CM | POA: Diagnosis not present

## 2017-09-20 ENCOUNTER — Encounter: Payer: Self-pay | Admitting: Gastroenterology

## 2017-09-20 ENCOUNTER — Ambulatory Visit (INDEPENDENT_AMBULATORY_CARE_PROVIDER_SITE_OTHER): Payer: Medicare Other | Admitting: Gastroenterology

## 2017-09-20 ENCOUNTER — Other Ambulatory Visit: Payer: Medicare Other

## 2017-09-20 VITALS — BP 124/70 | HR 82 | Ht 61.0 in | Wt 142.0 lb

## 2017-09-20 DIAGNOSIS — R740 Nonspecific elevation of levels of transaminase and lactic acid dehydrogenase [LDH]: Secondary | ICD-10-CM

## 2017-09-20 DIAGNOSIS — K760 Fatty (change of) liver, not elsewhere classified: Secondary | ICD-10-CM

## 2017-09-20 DIAGNOSIS — R7401 Elevation of levels of liver transaminase levels: Secondary | ICD-10-CM

## 2017-09-20 NOTE — Progress Notes (Signed)
History of Present Illness: This is an 84 year old female referred by Jani Gravel, MD for the evaluation of abnormal LFTs. 07/2017 AST=65, ALT=97 and rest are normal. ANA negative, AMA negative, ferritin normal, Fe/TIBC normal, A1AT normal, ceruloplasmin normal. RUQ [place] in 12/62018 showed cholelithiasis, fatty liver, CBD=4.7 mm.  There is no personal history or family history of liver disease.  She has a history of hyperlipidemia treated with Crestor which was recently discontinued. Denies weight loss, abdominal pain, constipation, diarrhea, change in stool caliber, melena, hematochezia, nausea, vomiting, dysphagia, reflux symptoms, chest pain.   Colonoscopy 03/2009: 2 small precancerous polyps, moderate left colon diverticulosis, internal hemorrhoids  Allergies  Allergen Reactions  . Claritin [Loratadine]     REACTION: headache   Outpatient Medications Prior to Visit  Medication Sig Dispense Refill  . amLODipine (NORVASC) 5 MG tablet Take 5 mg by mouth daily.     . hyoscyamine (LEVSIN SL) 0.125 MG SL tablet Place 1 tablet (0.125 mg total) under the tongue 4 (four) times daily as needed. 100 tablet 11  . irbesartan (AVAPRO) 300 MG tablet Take 300 mg by mouth daily.     . montelukast (SINGULAIR) 10 MG tablet Take 10 mg by mouth daily.     Marland Kitchen omeprazole (PRILOSEC) 40 MG capsule Take 1 capsule (40 mg total) by mouth daily. 30 capsule 11  . VESICARE 5 MG tablet Take 2.5 mg by mouth daily.     Marland Kitchen aspirin 81 MG tablet Take 81 mg by mouth daily.    . cephALEXin (KEFLEX) 500 MG capsule Take 500 mg by mouth 4 (four) times daily.    . Cholecalciferol (VITAMIN D HIGH POTENCY PO) Take by mouth once a week.    Marland Kitchen ibuprofen (ADVIL,MOTRIN) 400 MG tablet Take 400 mg by mouth 3 (three) times daily.    . rosuvastatin (CRESTOR) 10 MG tablet Take 10 mg by mouth daily.       No facility-administered medications prior to visit.    Past Medical History:  Diagnosis Date  . Adenomatous colon polyp 02/1993  .  Allergic rhinitis   . Arthritis   . Asthma   . Breast cancer ([place])    Right breast  . Diverticulosis   . GERD (gastroesophageal reflux disease)   . Gout   . Hiatal hernia   . Hyperlipidemia   . Hypertension   . IBS (irritable bowel syndrome)   . Internal hemorrhoids   . Varicose veins    superificial thrombophlebitis right leg  09-2013   Past Surgical History:  Procedure Laterality Date  . CATARACT EXTRACTION, BILATERAL  08/2012, 09/2012  . CYSTECTOMY Right    ov[place]  . MASTECTOMY Right 1987   Social History   Socioeconomic History  . Marital status: Widowed    Spouse name: Not on file  . Number of children: 2  . Years of education: Not on file  . Highest education level: Not on file  Occupational History  . Occupation: Press photographer retired  Scientific laboratory technician  . Financial resource strain: Not on file  . Food insecurity:    Worry: Not on file    Inability: Not on file  . Transportation needs:    Medical: Not on file    Non-medical: Not on file  Tobacco Use  . Smoking status: Never Smoker  . Smokeless tobacco: Never Used  Substance and Sexual Activity  . Alcohol use: Yes    Comment: social  . Drug use: No  . Sexual activity: Not  on file  Lifestyle  . Physical activity:    Days per week: Not on file    Minutes per session: Not on file  . Stress: Not on file  Relationships  . Social connections:    Talks on phone: Not on file    Gets together: Not on file    Attends religious service: Not on file    Active member of club or organization: Not on file    Attends meetings of clubs or organizations: Not on file    Relationship status: Not on file  Other Topics Concern  . Not on file  Social History Narrative  . Not on file   Family History  Family history unknown: Yes      Review of Systems: Pertinent positive and negative review of systems were noted in the above HPI section. All other review of systems were otherwise negative.    Physical Exam: General:  Well developed, well nourished, no acute distress Head: Normocephalic and atraumatic Eyes:  sclerae anicteric, EOMI Ears: Normal auditory acuity Mouth: No deformity or lesions Neck: Supple, no masses or thyromegaly Lungs: Clear throughout to auscultation Heart: Regular rate and rhythm; no murmurs, rubs or bruits Abdomen: Soft, non tender and non distended. No masses, hepatosplenomegaly or hernias noted. Normal Bowel sounds Musculoskeletal: Symmetrical with no gross deformities  Skin: No lesions on visible extremities Pulses:  Normal pulses noted Extremities: No clubbing, cyanosis, edema or deformities noted Neurological: Alert oriented x 4, grossly nonfocal Cervical Nodes:  No significant cervical adenopathy Inguinal Nodes: No significant inguinal adenopathy Psychological:  Alert and cooperative. Normal mood and affect   Assessment and Recommendations:  1. Mildly elevated transaminases highly likely secondary hepatic steatosis. R/O viral hepatitis, statin side effect. Repeat LFTs, Hep B and C serologies today. Will contact Dr. Julianne Rice office to obtain LFTs from the past few years. We will contact the patient with result of her blood work for next steps.  If blood work points to hepatic steatosis, which is the most likely etiology, then appropriate management of hyperlipidemia, including statin therapy, and weight will be the recommended course.   2.  Cholelithiasis, asymptomatic.  No symptoms of, or evidence for, biliary obstruction by ultrasound.   3. IBS. Continue hysocyamine 0.125 mg q4h prn.    4.  Personal history of adenomatous colon polyps.  Due to her age she is no longer enrolled in a  colonoscopy surveillance program.   5. GERD. Continue omeprazole 40 mg po qam and standard antireflux measures.      cc: Jani Gravel, MD [address] [place], [place] [address]

## 2017-09-20 NOTE — Patient Instructions (Signed)
If you are age 82 or older, your body mass index should be between 23-30. Your Body mass index is 26.83 kg/m. If this is out of the aforementioned range listed, please consider follow up with your Primary Care Provider.  If you are age 85 or younger, your body mass index should be between 19-25. Your Body mass index is 26.83 kg/m. If this is out of the aformentioned range listed, please consider follow up with your Primary Care Provider.    Your provider has requested that you go to the basement level for lab work before leaving today. Press "B" on the elevator. The lab is located at the first door on the left as you exit the elevator.

## 2017-09-21 LAB — HEPATITIS C ANTIBODY
Hepatitis C Ab: NONREACTIVE
SIGNAL TO CUT-OFF: 0.01 (ref <1.00)

## 2017-09-21 LAB — HEPATITIS B SURFACE ANTIBODY,QUALITATIVE: Hep B S Ab: NONREACTIVE

## 2017-09-21 LAB — HEPATITIS B SURFACE ANTIGEN: Hepatitis B Surface Ag: NONREACTIVE

## 2017-09-28 DIAGNOSIS — E7800 Pure hypercholesterolemia, unspecified: Secondary | ICD-10-CM | POA: Diagnosis not present

## 2017-09-28 DIAGNOSIS — K7689 Other specified diseases of liver: Secondary | ICD-10-CM | POA: Diagnosis not present

## 2017-09-28 DIAGNOSIS — E039 Hypothyroidism, unspecified: Secondary | ICD-10-CM | POA: Diagnosis not present

## 2017-09-29 ENCOUNTER — Other Ambulatory Visit: Payer: Self-pay

## 2017-09-29 DIAGNOSIS — I8311 Varicose veins of right lower extremity with inflammation: Secondary | ICD-10-CM

## 2017-10-05 DIAGNOSIS — E039 Hypothyroidism, unspecified: Secondary | ICD-10-CM | POA: Diagnosis not present

## 2017-10-05 DIAGNOSIS — K7689 Other specified diseases of liver: Secondary | ICD-10-CM | POA: Diagnosis not present

## 2017-10-05 DIAGNOSIS — I809 Phlebitis and thrombophlebitis of unspecified site: Secondary | ICD-10-CM | POA: Diagnosis not present

## 2017-10-05 DIAGNOSIS — I10 Essential (primary) hypertension: Secondary | ICD-10-CM | POA: Diagnosis not present

## 2017-11-02 DIAGNOSIS — Z853 Personal history of malignant neoplasm of breast: Secondary | ICD-10-CM | POA: Diagnosis not present

## 2017-11-02 DIAGNOSIS — Z1231 Encounter for screening mammogram for malignant neoplasm of breast: Secondary | ICD-10-CM | POA: Diagnosis not present

## 2017-11-05 ENCOUNTER — Ambulatory Visit (HOSPITAL_COMMUNITY)
Admission: RE | Admit: 2017-11-05 | Discharge: 2017-11-05 | Disposition: A | Discharge status: Home / Self Care | Payer: Medicare Other | Source: Ambulatory Visit | Attending: Vascular Surgery | Admitting: Vascular Surgery

## 2017-11-05 DIAGNOSIS — I872 Venous insufficiency (chronic) (peripheral): Secondary | ICD-10-CM | POA: Diagnosis not present

## 2017-11-05 DIAGNOSIS — I8311 Varicose veins of right lower extremity with inflammation: Secondary | ICD-10-CM | POA: Insufficient documentation

## 2017-11-09 ENCOUNTER — Other Ambulatory Visit: Payer: Self-pay

## 2017-11-09 ENCOUNTER — Ambulatory Visit (INDEPENDENT_AMBULATORY_CARE_PROVIDER_SITE_OTHER): Payer: Medicare Other | Admitting: Vascular Surgery

## 2017-11-09 ENCOUNTER — Encounter: Payer: Self-pay | Admitting: Vascular Surgery

## 2017-11-09 VITALS — BP 175/98 | HR 74 | Resp 18 | Ht 61.0 in | Wt 145.0 lb

## 2017-11-09 DIAGNOSIS — I8311 Varicose veins of right lower extremity with inflammation: Secondary | ICD-10-CM

## 2017-11-09 NOTE — Progress Notes (Signed)
Vascular and Vein Specialist of [facility]  Patient name: Nancy Webb MRN: [id number] DOB: 01-30-1932 Sex: female  REASON FOR CONSULT: Evaluation right leg venous hypertension  HPI: Nancy Webb is a 84 y.o. female, who is here today for discussion of recent episode of possible thrombophlebitis in her right leg.  She has a known history of venous hypertension.  I seen her in 2015.  At that time she had dilatation of her great saphenous vein and reflux.  She was controlled with compression garments and was comfortable with conservative treatment.  Proximally 1 month ago she had an episode of erythema and tenderness in her right pretibial area.  She resolution of this over the course of 3 to 5 days.  Past Medical History:  Diagnosis Date  . Adenomatous colon polyp 02/1993  . Allergic rhinitis   . Arthritis   . Asthma   . Breast cancer ([place])    Right breast  . Diverticulosis   . GERD (gastroesophageal reflux disease)   . Gout   . Hiatal hernia   . Hyperlipidemia   . Hypertension   . IBS (irritable bowel syndrome)   . Internal hemorrhoids   . Varicose veins    superificial thrombophlebitis right leg  09-2013    Family History  Family history unknown: Yes    SOCIAL HISTORY: Social History   Socioeconomic History  . Marital status: Widowed    Spouse name: Not on file  . Number of children: 2  . Years of education: Not on file  . Highest education level: Not on file  Occupational History  . Occupation: Press photographer retired  Scientific laboratory technician  . Financial resource strain: Not on file  . Food insecurity:    Worry: Not on file    Inability: Not on file  . Transportation needs:    Medical: Not on file    Non-medical: Not on file  Tobacco Use  . Smoking status: Never Smoker  . Smokeless tobacco: Never Used  Substance and Sexual Activity  . Alcohol use: Yes    Comment: social  . Drug use: No  . Sexual activity: Not on file  Lifestyle    . Physical activity:    Days per week: Not on file    Minutes per session: Not on file  . Stress: Not on file  Relationships  . Social connections:    Talks on phone: Not on file    Gets together: Not on file    Attends religious service: Not on file    Active member of club or organization: Not on file    Attends meetings of clubs or organizations: Not on file    Relationship status: Not on file  . Intimate partner violence:    Fear of current or ex partner: Not on file    Emotionally abused: Not on file    Physically abused: Not on file    Forced sexual activity: Not on file  Other Topics Concern  . Not on file  Social History Narrative  . Not on file    Allergies  Allergen Reactions  . Claritin [Loratadine]     REACTION: headache    Current Outpatient Medications  Medication Sig Dispense Refill  . amLODipine (NORVASC) 5 MG tablet Take 5 mg by mouth daily.     . hyoscyamine (LEVSIN SL) 0.125 MG SL tablet Place 1 tablet (0.125 mg total) under the tongue 4 (four) times daily as needed. 100 tablet 11  . irbesartan (  AVAPRO) 300 MG tablet Take 300 mg by mouth daily.     . methocarbamol (ROBAXIN) 500 MG tablet Take 500 mg by mouth at bedtime as needed.     . montelukast (SINGULAIR) 10 MG tablet Take 10 mg by mouth daily.     Marland Kitchen omeprazole (PRILOSEC) 40 MG capsule Take 1 capsule (40 mg total) by mouth daily. 30 capsule 11  . VESICARE 5 MG tablet Take 2.5 mg by mouth daily.      No current facility-administered medications for this visit.     REVIEW OF SYSTEMS:  [X]  denotes positive finding, [ ]  denotes negative finding Cardiac  Comments:  Chest pain or chest pressure:    Shortness of breath upon exertion:    Short of breath when lying flat:    Irregular heart rhythm:        Vascular    Pain in calf, thigh, or hip brought on by ambulation:    Pain in feet at night that wakes you up from your sleep:     Blood clot in your veins:    Leg swelling:  x       Pulmonary     Oxygen at home:    Productive cough:     Wheezing:         Neurologic    Sudden weakness in arms or legs:     Sudden numbness in arms or legs:     Sudden onset of difficulty speaking or slurred speech:    Temporary loss of vision in one eye:     Problems with dizziness:         Gastrointestinal    Blood in stool:     Vomited blood:         Genitourinary    Burning when urinating:     Blood in urine:        Psychiatric    Major depression:         Hematologic    Bleeding problems:    Problems with blood clotting too easily:        Skin    Rashes or ulcers:        Constitutional    Fever or chills:      PHYSICAL EXAM: Vitals:   11/09/17 1338 11/09/17 1340  BP: (!) 186/104 (!) 175/98  Pulse: 74   Resp: 18   SpO2: 98%   Weight: 145 lb (65.8 kg)   Height: 5\' 1"  (1.549 m)     GENERAL: The patient is a well-nourished female, in no acute distress. The vital signs are documented above. CARDIOVASCULAR: 2+ dorsalis pedis pulses bilaterally.  Marked varicosities more so on the right than on the left.  No skin breakdown PULMONARY: There is good air exchange  ABDOMEN: Soft and non-tender  MUSCULOSKELETAL: There are no major deformities or cyanosis. NEUROLOGIC: No focal weakness or paresthesias are detected. SKIN: There are no ulcers or rashes noted. PSYCHIATRIC: The patient has a normal affect.  DATA:  Duplex from 11/05/2017 was reviewed.  This did show no evidence of deep venous thrombosis or superficial thrombophlebitis.  Does have reflux in her great saphenous vein bilaterally with dilatation throughout on the right  MEDICAL ISSUES: I again discussed options with the patient.  She is tolerating conservative treatment of her venous hypertension.  I did explain that she would be a candidate for ablation.  She is very faithful in wearing her compression and is having adequate relief at this time.  She is  comfortable with observation only   Rosetta Posner, MD  [facility] Vascular and Vein Specialists of [facility] Tel [phone] Pager [phone]

## 2017-11-19 ENCOUNTER — Telehealth: Payer: Self-pay | Admitting: *Deleted

## 2017-11-19 NOTE — Telephone Encounter (Signed)
Pt's daughter had called in wanting to know what Dr. Luther Parody plan was for her mom. I left a message saying the pt had opted to continue conservative therapy at this time. She has a fu visit with Dr. Scot Dock in Jan. I said that she could return sooner if her mom's discomfort has worsened. She would be a candidate for LA of the right leg. Follow prn

## 2017-12-14 DIAGNOSIS — H3562 Retinal hemorrhage, left eye: Secondary | ICD-10-CM | POA: Diagnosis not present

## 2017-12-14 DIAGNOSIS — H40012 Open angle with borderline findings, low risk, left eye: Secondary | ICD-10-CM | POA: Diagnosis not present

## 2017-12-14 DIAGNOSIS — H401411 Capsular glaucoma with pseudoexfoliation of lens, right eye, mild stage: Secondary | ICD-10-CM | POA: Diagnosis not present

## 2017-12-14 DIAGNOSIS — H52223 Regular astigmatism, bilateral: Secondary | ICD-10-CM | POA: Diagnosis not present

## 2017-12-14 DIAGNOSIS — H524 Presbyopia: Secondary | ICD-10-CM | POA: Diagnosis not present

## 2017-12-14 DIAGNOSIS — H26492 Other secondary cataract, left eye: Secondary | ICD-10-CM | POA: Diagnosis not present

## 2017-12-14 DIAGNOSIS — H04123 Dry eye syndrome of bilateral lacrimal glands: Secondary | ICD-10-CM | POA: Diagnosis not present

## 2018-02-01 DIAGNOSIS — I10 Essential (primary) hypertension: Secondary | ICD-10-CM | POA: Diagnosis not present

## 2018-02-01 DIAGNOSIS — E039 Hypothyroidism, unspecified: Secondary | ICD-10-CM | POA: Diagnosis not present

## 2018-02-08 DIAGNOSIS — I10 Essential (primary) hypertension: Secondary | ICD-10-CM | POA: Diagnosis not present

## 2018-02-08 DIAGNOSIS — E039 Hypothyroidism, unspecified: Secondary | ICD-10-CM | POA: Diagnosis not present

## 2018-02-08 DIAGNOSIS — K7689 Other specified diseases of liver: Secondary | ICD-10-CM | POA: Diagnosis not present

## 2018-04-05 ENCOUNTER — Telehealth: Payer: Self-pay | Admitting: *Deleted

## 2018-04-05 ENCOUNTER — Other Ambulatory Visit: Payer: Self-pay | Admitting: *Deleted

## 2018-04-05 DIAGNOSIS — I83811 Varicose veins of right lower extremities with pain: Secondary | ICD-10-CM

## 2018-04-05 NOTE — Telephone Encounter (Signed)
Returning Nancy Webb's telephone voice message.  She states 2 weeks ago, she had a "severe cramp" in her right leg with pain and tenderness from knee down.  She denies right leg/ankle/foot pain or swelling currently.  She denies right leg pain currently. She was seen in late May 2019 by Dr. Donnetta Hutching for superficial thrombophlebitis.  She is scheduled to see Dr. Scot Dock for an appointment in January 2020 with instructions to call sooner if her right leg symptoms flared.  Will have schedulers schedule a venous reflux of right leg and schedule an appointment with Dr. Scot Dock.  Nancy Webb in agreement with this plan.

## 2018-04-06 ENCOUNTER — Telehealth: Payer: Self-pay | Admitting: Vascular Surgery

## 2018-04-06 NOTE — Telephone Encounter (Signed)
sch appt lvm mld ltr 04/27/18 11am Fel[place] 1145am Vv f/u Md

## 2018-04-06 NOTE — Telephone Encounter (Signed)
-----   Message from Rica Records, RN sent at 04/05/2018  9:30 AM EDT ----- Regarding: scheduling Nancy Webb has a VV FU scheduled with Dr. Scot Dock for 07-06-2018. She has a history of superficial thrombophlebitis in right leg and was seen by Dr. Donnetta Hutching in May 2019.  Her right leg symptoms are now flaring. Please schedule right leg venous reflux study and VV FU with Dr. Scot Dock. Thanks!!!!!

## 2018-04-08 ENCOUNTER — Telehealth: Payer: Self-pay | Admitting: Vascular Surgery

## 2018-04-08 NOTE — Telephone Encounter (Signed)
resch appt to 05-04-18 10am reflux 1045am vv f/u MD spk to pt mld new ltr

## 2018-04-08 NOTE — Telephone Encounter (Signed)
-----   Message from Ovidio Hanger sent at 04/07/2018 11:56 AM EDT ----- Regarding: F/U Vein patient I see that Ms. Bhavsar was scheduled to see Dr. Scot Dock on 11/13 for a VV F/U with symptoms per a staff message from [place] on 10/22 (thank you for putting that in the telephone encounter).    I need to block Dr. Nicole Cella schedule on 11/13 for a period of time around lunch.  Could you please move her to a Vein day?    I appreciate it. Delsa Sale

## 2018-04-12 DIAGNOSIS — D225 Melanocytic nevi of trunk: Secondary | ICD-10-CM | POA: Diagnosis not present

## 2018-04-12 DIAGNOSIS — D2271 Melanocytic nevi of right lower limb, including hip: Secondary | ICD-10-CM | POA: Diagnosis not present

## 2018-04-12 DIAGNOSIS — D1801 Hemangioma of skin and subcutaneous tissue: Secondary | ICD-10-CM | POA: Diagnosis not present

## 2018-04-12 DIAGNOSIS — D3613 Benign neoplasm of peripheral nerves and autonomic nervous system of lower limb, including hip: Secondary | ICD-10-CM | POA: Diagnosis not present

## 2018-04-12 DIAGNOSIS — L570 Actinic keratosis: Secondary | ICD-10-CM | POA: Diagnosis not present

## 2018-04-12 DIAGNOSIS — L821 Other seborrheic keratosis: Secondary | ICD-10-CM | POA: Diagnosis not present

## 2018-04-12 DIAGNOSIS — Z85828 Personal history of other malignant neoplasm of skin: Secondary | ICD-10-CM | POA: Diagnosis not present

## 2018-04-19 DIAGNOSIS — H04123 Dry eye syndrome of bilateral lacrimal glands: Secondary | ICD-10-CM | POA: Diagnosis not present

## 2018-04-19 DIAGNOSIS — H40012 Open angle with borderline findings, low risk, left eye: Secondary | ICD-10-CM | POA: Diagnosis not present

## 2018-04-19 DIAGNOSIS — H5319 Other subjective visual disturbances: Secondary | ICD-10-CM | POA: Diagnosis not present

## 2018-04-19 DIAGNOSIS — H401411 Capsular glaucoma with pseudoexfoliation of lens, right eye, mild stage: Secondary | ICD-10-CM | POA: Diagnosis not present

## 2018-04-27 ENCOUNTER — Ambulatory Visit: Payer: Medicare Other | Admitting: Vascular Surgery

## 2018-04-27 ENCOUNTER — Encounter (HOSPITAL_COMMUNITY): Payer: Medicare Other

## 2018-05-03 DIAGNOSIS — Z01419 Encounter for gynecological examination (general) (routine) without abnormal findings: Secondary | ICD-10-CM | POA: Diagnosis not present

## 2018-05-04 ENCOUNTER — Ambulatory Visit (HOSPITAL_COMMUNITY)
Admission: RE | Admit: 2018-05-04 | Discharge: 2018-05-04 | Disposition: A | Discharge status: Home / Self Care | Payer: Medicare Other | Source: Ambulatory Visit | Attending: Vascular Surgery | Admitting: Vascular Surgery

## 2018-05-04 ENCOUNTER — Encounter: Payer: Self-pay | Admitting: Vascular Surgery

## 2018-05-04 ENCOUNTER — Other Ambulatory Visit: Payer: Self-pay | Admitting: *Deleted

## 2018-05-04 ENCOUNTER — Ambulatory Visit (INDEPENDENT_AMBULATORY_CARE_PROVIDER_SITE_OTHER): Payer: Medicare Other | Admitting: Vascular Surgery

## 2018-05-04 VITALS — BP 194/109 | HR 72 | Temp 97.3°F | Resp 16 | Ht 61.0 in | Wt 146.0 lb

## 2018-05-04 DIAGNOSIS — I872 Venous insufficiency (chronic) (peripheral): Secondary | ICD-10-CM | POA: Diagnosis not present

## 2018-05-04 DIAGNOSIS — I83811 Varicose veins of right lower extremities with pain: Secondary | ICD-10-CM | POA: Diagnosis not present

## 2018-05-04 NOTE — Progress Notes (Signed)
REASON FOR CONSULT:    Follow-up of chronic venous insufficiency.  HPI:   Nancy Webb is a pleasant 84 y.o. female, who was seen by Dr. Sherren Mocha Early on 11/09/2017.  She had an episode of thrombophlebitis of the right leg.  He had previously seen her in 2015 with chronic venous insufficiency.  She has been wearing her compression stockings and Dr. Donnetta Hutching felt that she could potentially be a candidate for endovenous laser ablation of her symptoms progressed.   The patient has had multiple episodes of phlebitis in her medial right calf.  She describes aching pain and heaviness in the right leg which is aggravated by standing and sitting and relieved somewhat with elevation.  She has taken ibuprofen as needed for pain.  She has been wearing her thigh-high compression stockings with a gradient of 20 to 30 mmHg for several years.  She wears these most of the day.  Past Medical History:  Diagnosis Date  . Adenomatous colon polyp 02/1993  . Allergic rhinitis   . Arthritis   . Asthma   . Breast cancer ([place])    Right breast  . Diverticulosis   . GERD (gastroesophageal reflux disease)   . Gout   . Hiatal hernia   . Hyperlipidemia   . Hypertension   . IBS (irritable bowel syndrome)   . Internal hemorrhoids   . Varicose veins    superificial thrombophlebitis right leg  09-2013    Family History  Family history unknown: Yes    SOCIAL HISTORY: Social History   Socioeconomic History  . Marital status: Widowed    Spouse name: Not on file  . Number of children: 2  . Years of education: Not on file  . Highest education level: Not on file  Occupational History  . Occupation: Press photographer retired  Scientific laboratory technician  . Financial resource strain: Not on file  . Food insecurity:    Worry: Not on file    Inability: Not on file  . Transportation needs:    Medical: Not on file    Non-medical: Not on file  Tobacco Use  . Smoking status: Never Smoker  . Smokeless tobacco: Never Used  Substance  and Sexual Activity  . Alcohol use: Yes    Comment: social  . Drug use: No  . Sexual activity: Not on file  Lifestyle  . Physical activity:    Days per week: Not on file    Minutes per session: Not on file  . Stress: Not on file  Relationships  . Social connections:    Talks on phone: Not on file    Gets together: Not on file    Attends religious service: Not on file    Active member of club or organization: Not on file    Attends meetings of clubs or organizations: Not on file    Relationship status: Not on file  . Intimate partner violence:    Fear of current or ex partner: Not on file    Emotionally abused: Not on file    Physically abused: Not on file    Forced sexual activity: Not on file  Other Topics Concern  . Not on file  Social History Narrative  . Not on file    Allergies  Allergen Reactions  . Claritin [Loratadine]     REACTION: headache    Current Outpatient Medications  Medication Sig Dispense Refill  . amLODipine (NORVASC) 5 MG tablet Take 5 mg by mouth daily.     Marland Kitchen  hyoscyamine (LEVSIN SL) 0.125 MG SL tablet Place 1 tablet (0.125 mg total) under the tongue 4 (four) times daily as needed. 100 tablet 11  . irbesartan (AVAPRO) 300 MG tablet Take 300 mg by mouth daily.     . montelukast (SINGULAIR) 10 MG tablet Take 10 mg by mouth daily.     Marland Kitchen omeprazole (PRILOSEC) 40 MG capsule Take 1 capsule (40 mg total) by mouth daily. 30 capsule 11  . VESICARE 5 MG tablet Take 2.5 mg by mouth daily.     . methocarbamol (ROBAXIN) 500 MG tablet Take 500 mg by mouth at bedtime as needed.      No current facility-administered medications for this visit.     REVIEW OF SYSTEMS:  [X]  denotes positive finding, [ ]  denotes negative finding Cardiac  Comments:  Chest pain or chest pressure:    Shortness of breath upon exertion:    Short of breath when lying flat:    Irregular heart rhythm:        Vascular    Pain in calf, thigh, or hip brought on by ambulation:    Pain  in feet at night that wakes you up from your sleep:     Blood clot in your veins:    Leg swelling:         Pulmonary    Oxygen at home:    Productive cough:     Wheezing:         Neurologic    Sudden weakness in arms or legs:     Sudden numbness in arms or legs:     Sudden onset of difficulty speaking or slurred speech:    Temporary loss of vision in one eye:     Problems with dizziness:         Gastrointestinal    Blood in stool:     Vomited blood:         Genitourinary    Burning when urinating:     Blood in urine:        Psychiatric    Major depression:         Hematologic    Bleeding problems:    Problems with blood clotting too easily:        Skin    Rashes or ulcers:        Constitutional    Fever or chills:     PHYSICAL EXAM:   Vitals:   05/04/18 1046  BP: (!) 194/109  Pulse: 72  Resp: 16  Temp: (!) 97.3 F (36.3 C)  SpO2: 98%  Weight: 146 lb (66.2 kg)  Height: 5\' 1"  (1.549 m)    GENERAL: The patient is a well-nourished female, in no acute distress. The vital signs are documented above. CARDIAC: There is a regular rate and rhythm.  VASCULAR: I do not detect carotid bruits. She has palpable femoral pulses. Both feet are warm and well-perfused. She has telangiectasias in both lower extremities with some mild hyperpigmentation. She has mild bilateral lower extremity swelling. I did look at her right great saphenous vein myself with the SonoSite and this is significantly dilated with reflux throughout the saphenous system down to the proximal calf.  I think the vein could be accessed around the level of the knee.  The vein is somewhat tortuous at the saphenofemoral junction but I think we could easily start endovenous laser ablation well below the saphenofemoral junction.  The vein is significantly dilated here. PULMONARY: There is good air exchange bilaterally  without wheezing or rales. ABDOMEN: Soft and non-tender with normal pitched bowel sounds.    MUSCULOSKELETAL: There are no major deformities or cyanosis. NEUROLOGIC: No focal weakness or paresthesias are detected. SKIN: There are no ulcers or rashes noted. PSYCHIATRIC: The patient has a normal affect.  DATA:    VENOUS DUPLEX: I have independently interpreted her venous duplex scan today.  There is no evidence of deep venous thrombosis or superficial thrombophlebitis in the right leg.  The patient has deep venous reflux involving the common femoral vein.  There is superficial venous reflux involving the entire right great saphenous vein.  The vein is significantly dilated with diameters ranging from 0.51 cm at the calf to 1.07 cm in the proximal thigh.   ASSESSMENT & PLAN:   CHRONIC VENOUS INSUFFICIENCY: This patient has CEAP C-4a venous disease and has had multiple episodes of phlebitis of the right leg.  She has been doing conservative treatment for several years now with elevation, thigh-high compression stockings, and ibuprofen as needed for pain.  He is continuing to have significant symptoms.  I think she is a reasonable candidate for endovenous laser ablation of the right great saphenous vein. I have discussed the indications for endovenous laser ablation of the right GSV, that is to lower the pressure in the veins and potentially help relieve the symptoms from venous hypertension. I have also discussed alternative options including conservative treatment with leg elevation, compression therapy, exercise, avoiding prolonged sitting and standing, and weight management. I have discussed the potential complications of the procedure, including, but not limited to: bleeding, bruising, leg swelling, nerve injury, skin burns, significant pain from phlebitis, deep venous thrombosis, or failure of the vein to close.  I have also explained that venous insufficiency is a chronic disease, and that the patient is at risk for recurrent varicose veins in the future.  All of the patient's questions were  encouraged and answered. They are agreeable to proceed.   HYPERTENSION: The patient's initial blood pressure today was elevated. We repeated this and this was still elevated. We have encouraged the patient to follow up with their primary care physician for management of their blood pressure.  She does tell me that her blood pressure typically has not been a problem so I think this is likely just a single event today.   Deitra Mayo Vascular and Vein Specialists of [facility] [phone]

## 2018-05-16 DIAGNOSIS — E559 Vitamin D deficiency, unspecified: Secondary | ICD-10-CM | POA: Diagnosis not present

## 2018-05-16 DIAGNOSIS — I10 Essential (primary) hypertension: Secondary | ICD-10-CM | POA: Diagnosis not present

## 2018-05-16 DIAGNOSIS — N390 Urinary tract infection, site not specified: Secondary | ICD-10-CM | POA: Diagnosis not present

## 2018-05-16 DIAGNOSIS — E789 Disorder of lipoprotein metabolism, unspecified: Secondary | ICD-10-CM | POA: Diagnosis not present

## 2018-05-24 DIAGNOSIS — K580 Irritable bowel syndrome with diarrhea: Secondary | ICD-10-CM | POA: Diagnosis not present

## 2018-05-24 DIAGNOSIS — K7689 Other specified diseases of liver: Secondary | ICD-10-CM | POA: Diagnosis not present

## 2018-05-24 DIAGNOSIS — E039 Hypothyroidism, unspecified: Secondary | ICD-10-CM | POA: Diagnosis not present

## 2018-05-24 DIAGNOSIS — M109 Gout, unspecified: Secondary | ICD-10-CM | POA: Diagnosis not present

## 2018-05-24 DIAGNOSIS — J45998 Other asthma: Secondary | ICD-10-CM | POA: Diagnosis not present

## 2018-05-24 DIAGNOSIS — C50919 Malignant neoplasm of unspecified site of unspecified female breast: Secondary | ICD-10-CM | POA: Diagnosis not present

## 2018-05-24 DIAGNOSIS — E789 Disorder of lipoprotein metabolism, unspecified: Secondary | ICD-10-CM | POA: Diagnosis not present

## 2018-05-24 DIAGNOSIS — E7800 Pure hypercholesterolemia, unspecified: Secondary | ICD-10-CM | POA: Diagnosis not present

## 2018-05-24 DIAGNOSIS — K219 Gastro-esophageal reflux disease without esophagitis: Secondary | ICD-10-CM | POA: Diagnosis not present

## 2018-05-24 DIAGNOSIS — I10 Essential (primary) hypertension: Secondary | ICD-10-CM | POA: Diagnosis not present

## 2018-06-27 DIAGNOSIS — I10 Essential (primary) hypertension: Secondary | ICD-10-CM | POA: Diagnosis not present

## 2018-07-06 ENCOUNTER — Ambulatory Visit: Payer: Medicare Other | Admitting: Vascular Surgery

## 2018-07-21 ENCOUNTER — Encounter: Payer: Self-pay | Admitting: Vascular Surgery

## 2018-07-21 ENCOUNTER — Other Ambulatory Visit: Payer: Self-pay

## 2018-07-21 ENCOUNTER — Ambulatory Visit (INDEPENDENT_AMBULATORY_CARE_PROVIDER_SITE_OTHER): Payer: Medicare Other | Admitting: Vascular Surgery

## 2018-07-21 VITALS — BP 152/99 | HR 82 | Resp 18 | Ht 61.0 in | Wt 146.0 lb

## 2018-07-21 DIAGNOSIS — I83811 Varicose veins of right lower extremities with pain: Secondary | ICD-10-CM

## 2018-07-21 HISTORY — PX: ENDOVENOUS ABLATION SAPHENOUS VEIN W/ LASER: SUR449

## 2018-07-21 NOTE — Progress Notes (Signed)
Patient name: Nancy Webb MRN: [id number] DOB: 03/05/1932 Sex: female  REASON FOR VISIT:   For endovenous laser ablation of the right great saphenous vein  HPI:   Nancy Webb is a pleasant 85 y.o. female who presents for laser ablation of the right great saphenous vein.  This patient has a history of chronic venous insufficiency and previous thrombophlebitis of the right lower extremity.  She had significant reflux throughout her entire right great saphenous vein was felt to be a good candidate for laser ablation to lower her risk of future episodes of thrombophlebitis and to relieve her symptoms related to her chronic venous insufficiency.  She has failed conservative treatment.  Current Outpatient Medications  Medication Sig Dispense Refill  . amLODipine (NORVASC) 5 MG tablet Take 5 mg by mouth daily.     . hyoscyamine (LEVSIN SL) 0.125 MG SL tablet Place 1 tablet (0.125 mg total) under the tongue 4 (four) times daily as needed. 100 tablet 11  . irbesartan (AVAPRO) 300 MG tablet Take 300 mg by mouth daily.     . methocarbamol (ROBAXIN) 500 MG tablet Take 500 mg by mouth at bedtime as needed.     . montelukast (SINGULAIR) 10 MG tablet Take 10 mg by mouth daily.     Marland Kitchen omeprazole (PRILOSEC) 40 MG capsule Take 1 capsule (40 mg total) by mouth daily. 30 capsule 11  . VESICARE 5 MG tablet Take 2.5 mg by mouth daily.      No current facility-administered medications for this visit.     REVIEW OF SYSTEMS:  [X]  denotes positive finding, [ ]  denotes negative finding Vascular    Leg swelling    Cardiac    Chest pain or chest pressure:    Shortness of breath upon exertion:    Short of breath when lying flat:    Irregular heart rhythm:    Constitutional    Fever or chills:     PHYSICAL EXAM:   Vitals:   07/21/18 1057  BP: (!) 152/99  Pulse: 82  Resp: 18  SpO2: 98%  Weight: 146 lb (66.2 kg)  Height: 5\' 1"  (1.549 m)   GENERAL: The patient is a well-nourished female, in no  acute distress. The vital signs are documented above.  DATA:   MEDICAL ISSUES:   LASER ABLATION RIGHT GREAT SAPHENOUS VEIN: Patient was taken to the exam room and placed supine.  I interrogated the right great saphenous vein with the SonoSite.  I elected to cannulate the vein just above the knee.  The right lower extremity was prepped and draped in usual sterile fashion.  Under ultrasound guidance, after the skin was anesthetized, the great saphenous vein was cannulated just above the knee with a micropuncture needle and a micropuncture sheath introduced over wire.  I then advanced the J-wire through the micropuncture sheath to just below the saphenofemoral junction distal to the superficial epigastric vein.  The sheath was advanced over the wire.  This is a 45 cm sheath.  The dilator was retracted.  I carefully positioned at the end of the sheath below the superficial epigastric vein.  The wire was removed and then the laser fiber placed to the end of the sheath and then the sheath retracted.  Tumescent anesthesia was administered the entire length of the great saphenous vein from just above the knee to the saphenofemoral junction.  The patient was placed in Trendelenburg.  Compression of the saphenofemoral junction was performed with the probe and then  a pullback was performed at approximately 1 to 2 mm/s.  A total of 2390 J of energy were used to the just above the knee.  Pressure dressing was applied.  Patient tolerated the procedure well.  She will return in 1 week for follow-up duplex.  I reviewed her postop instructions with her.  Deitra Mayo [facility] [phone]

## 2018-07-21 NOTE — Progress Notes (Signed)
     Laser Ablation Procedure    Date: 07/21/2018   Nancy Webb DOB:July 05, 1931  Consent signed: Yes    Surgeon:  Dr. Deitra Mayo  Procedure: Laser Ablation: right Greater Saphenous Vein  BP (!) 152/99 (BP Location: Left Arm, Patient Position: Sitting, Cuff Size: Normal)   Pulse 82   Resp 18   Ht 5\' 1"  (1.549 m)   Wt 146 lb (66.2 kg)   SpO2 98%   BMI 27.59 kg/m   Tumescent Anesthesia: 450 cc 0.9% NaCl with 50 cc Lidocaine HCL 1% and 15 cc 8.4% NaHCO3  Local Anesthesia: 6 cc Lidocaine HCL and NaHCO3 (ratio 2:1)  15 watts continuous mode        Total energy: 2390 Joules   Total time: 2:38    Patient tolerated procedure well    Description of Procedure:  After marking the course of the secondary varicosities, the patient was placed on the operating table in the supine position, and the right leg was prepped and draped in sterile fashion.   Local anesthetic was administered and under ultrasound guidance the saphenous vein was accessed with a micro needle and guide wire; then the mirco puncture sheath was placed.  A guide wire was inserted saphenofemoral junction , followed by a 5 french sheath.  The position of the sheath and then the laser fiber below the junction was confirmed using the ultrasound.  Tumescent anesthesia was administered along the course of the saphenous vein using ultrasound guidance. The patient was placed in Trendelenburg position and protective laser glasses were placed on patient and staff, and the laser was fired at 15 watts continuous mode advancing 1-27mm/second for a total of 2390 joules.    Steri strip was applied to the IV insertion site and ABD pads and thigh high compression stockings were applied.  Ace wrap bandages were applied over the right thigh and at the top of the saphenofemoral junction. Blood loss was less than 15 cc.  The patient ambulated out of the operating room having tolerated the procedure well.

## 2018-07-28 ENCOUNTER — Ambulatory Visit (HOSPITAL_COMMUNITY)
Admission: RE | Admit: 2018-07-28 | Discharge: 2018-07-28 | Disposition: A | Discharge status: Home / Self Care | Payer: Medicare Other | Source: Ambulatory Visit | Attending: Vascular Surgery | Admitting: Vascular Surgery

## 2018-07-28 ENCOUNTER — Encounter: Payer: Self-pay | Admitting: Vascular Surgery

## 2018-07-28 ENCOUNTER — Other Ambulatory Visit: Payer: Self-pay

## 2018-07-28 ENCOUNTER — Ambulatory Visit (INDEPENDENT_AMBULATORY_CARE_PROVIDER_SITE_OTHER): Payer: Medicare Other | Admitting: Vascular Surgery

## 2018-07-28 VITALS — BP 152/88 | HR 72 | Temp 97.9°F | Resp 16 | Ht 61.5 in | Wt 141.8 lb

## 2018-07-28 DIAGNOSIS — I83811 Varicose veins of right lower extremities with pain: Secondary | ICD-10-CM

## 2018-07-28 NOTE — Progress Notes (Signed)
Patient is an 85 year old female who returns for postoperative follow-up today after laser ablation of her right greater saphenous vein by Dr. Scot Dock on February 6.  She reports no pain in her leg.  She has no significant swelling.  She denies shortness of breath or chest pain.  She has no symptoms of hemoptysis.  Has been compliant wearing her compression garment.  She does take aspirin chronically.  Current Outpatient Medications on File Prior to Visit  Medication Sig Dispense Refill  . amLODipine (NORVASC) 5 MG tablet Take 5 mg by mouth daily.     . hyoscyamine (LEVSIN SL) 0.125 MG SL tablet Place 1 tablet (0.125 mg total) under the tongue 4 (four) times daily as needed. 100 tablet 11  . irbesartan (AVAPRO) 300 MG tablet Take 300 mg by mouth daily.     . methocarbamol (ROBAXIN) 500 MG tablet Take 500 mg by mouth at bedtime as needed.     . montelukast (SINGULAIR) 10 MG tablet Take 10 mg by mouth daily.     Marland Kitchen omeprazole (PRILOSEC) 40 MG capsule Take 1 capsule (40 mg total) by mouth daily. 30 capsule 11  . VESICARE 5 MG tablet Take 2.5 mg by mouth daily.      No current facility-administered medications on file prior to visit.    Aspirin 81 mg qd  Physical exam:  Vitals:   07/28/18 1354  BP: (!) 152/88  Pulse: 72  Resp: 16  Temp: 97.9 F (36.6 C)  TempSrc: Oral  SpO2: 98%  Weight: 141 lb 12.1 oz (64.3 kg)  Height: 5' 1.5" (1.562 m)   Right lower extremity well-healed incision right medial thigh adjacent to the knee mild ecchymosis right medial thigh and groin  Data: Patient had a duplex ultrasound of her right leg today which showed good closure of the right greater saphenous vein.  However she did have thrombus extending up to the greater saphenous vein and slightly into the common femoral vein.  EHIT5.  I reviewed and interpreted the study.  Assessment: Doing well status post laser ablation of her right greater saphenous vein.  She did have extension of thrombus up to the common  femoral vein.  She is asymptomatic from this.  Usually overall these are fairly low risk so I do not believe the risk of full anticoagulation outweighs the risk of bleeding.  Plan: The patient will follow-up in 2 weeks with Dr. Scot Dock and have a repeat duplex ultrasound.  If at that time the area of thrombus is stable most likely aspirin therapy alone should be sufficient.  Spoke with the patient and her daughter today about signs and symptoms of pulmonary embolus including worsening swelling in her leg hemoptysis significant shortness of breath and they will call us if she experiences any symptoms.  Ruta Hinds, MD [facility] [place] Office: [phone] Pager: [phone]

## 2018-07-29 ENCOUNTER — Encounter: Payer: Self-pay | Admitting: Vascular Surgery

## 2018-08-05 ENCOUNTER — Other Ambulatory Visit: Payer: Self-pay

## 2018-08-05 DIAGNOSIS — I872 Venous insufficiency (chronic) (peripheral): Secondary | ICD-10-CM

## 2018-08-05 DIAGNOSIS — I83811 Varicose veins of right lower extremities with pain: Secondary | ICD-10-CM

## 2018-08-05 DIAGNOSIS — I8311 Varicose veins of right lower extremity with inflammation: Secondary | ICD-10-CM

## 2018-08-11 ENCOUNTER — Other Ambulatory Visit: Payer: Self-pay

## 2018-08-11 ENCOUNTER — Encounter (HOSPITAL_COMMUNITY): Payer: Medicare Other

## 2018-08-11 ENCOUNTER — Ambulatory Visit (HOSPITAL_COMMUNITY)
Admission: RE | Admit: 2018-08-11 | Discharge: 2018-08-11 | Disposition: A | Discharge status: Home / Self Care | Payer: Medicare Other | Source: Ambulatory Visit | Attending: Family | Admitting: Family

## 2018-08-11 ENCOUNTER — Ambulatory Visit: Payer: Medicare Other | Admitting: Vascular Surgery

## 2018-08-11 ENCOUNTER — Encounter: Payer: Self-pay | Admitting: Vascular Surgery

## 2018-08-11 ENCOUNTER — Ambulatory Visit (INDEPENDENT_AMBULATORY_CARE_PROVIDER_SITE_OTHER): Payer: Medicare Other | Admitting: Vascular Surgery

## 2018-08-11 VITALS — BP 131/88 | HR 91 | Temp 96.9°F | Resp 16 | Ht 61.5 in | Wt 145.0 lb

## 2018-08-11 DIAGNOSIS — I83811 Varicose veins of right lower extremities with pain: Secondary | ICD-10-CM

## 2018-08-11 DIAGNOSIS — I8311 Varicose veins of right lower extremity with inflammation: Secondary | ICD-10-CM | POA: Diagnosis not present

## 2018-08-11 DIAGNOSIS — I872 Venous insufficiency (chronic) (peripheral): Secondary | ICD-10-CM | POA: Diagnosis not present

## 2018-08-11 NOTE — Progress Notes (Signed)
   Patient name: Nancy Webb MRN: [id number] DOB: Jan 03, 1932 Sex: female  REASON FOR VISIT:   Follow-up after laser ablation of the right great saphenous vein  HPI:   Nancy Webb is a pleasant 85 y.o. female who underwent laser ablation of the right great saphenous vein on 07/21/2018.  On her one-week follow-up visit she was noted to have an EHIT 2, with some clot extending from the saphenous vein into the common femoral vein.  She was encouraged to continue taking her aspirin and elevate her legs and returns for a 2-week follow-up visit.  She denies any significant pain or swelling in the right leg.  She denies any chest pain or shortness of breath.  Current Outpatient Medications  Medication Sig Dispense Refill  . amLODipine (NORVASC) 5 MG tablet Take 5 mg by mouth daily.     . hyoscyamine (LEVSIN SL) 0.125 MG SL tablet Place 1 tablet (0.125 mg total) under the tongue 4 (four) times daily as needed. 100 tablet 11  . irbesartan (AVAPRO) 300 MG tablet Take 300 mg by mouth daily.     . methocarbamol (ROBAXIN) 500 MG tablet Take 500 mg by mouth at bedtime as needed.     . montelukast (SINGULAIR) 10 MG tablet Take 10 mg by mouth daily.     Marland Kitchen omeprazole (PRILOSEC) 40 MG capsule Take 1 capsule (40 mg total) by mouth daily. 30 capsule 11  . VESICARE 5 MG tablet Take 2.5 mg by mouth daily.      No current facility-administered medications for this visit.     REVIEW OF SYSTEMS:  [X]  denotes positive finding, [ ]  denotes negative finding Vascular    Leg swelling    Cardiac    Chest pain or chest pressure:    Shortness of breath upon exertion:    Short of breath when lying flat:    Irregular heart rhythm:    Constitutional    Fever or chills:     PHYSICAL EXAM:   Vitals:   08/11/18 1435  BP: 131/88  Pulse: 91  Resp: 16  Temp: (!) 96.9 F (36.1 C)  TempSrc: Oral  SpO2: 97%  Weight: 145 lb (65.8 kg)  Height: 5' 1.5" (1.562 m)    GENERAL: The patient is a well-nourished  female, in no acute distress. The vital signs are documented above. CARDIOVASCULAR: There is a regular rate and rhythm. PULMONARY: There is good air exchange bilaterally without wheezing or rales. VASCULAR: She has no significant right leg swelling.  She has no bruising in the right thigh.  DATA:   DUPLEX: I have independently interpreted her venous duplex scan.  The clot in the saphenous vein no longer extends into the common femoral vein.   MEDICAL ISSUES:   STATUS POST LASER ABLATION RIGHT GREAT SAPHENOUS VEIN: This patient developed some propagation of the clot into the common femoral vein compromising less than 50% of the lumen on her follow-up study on 07/28/2018.  Her follow-up study today shows that this has resolved.  I have encouraged her to continue taking her aspirin.  We also discussed the importance of intermittent leg elevation and the proper positioning for this.  I will see her back as needed.  Deitra Mayo [facility] [phone]

## 2018-09-01 DIAGNOSIS — C4442 Squamous cell carcinoma of skin of scalp and neck: Secondary | ICD-10-CM | POA: Diagnosis not present

## 2018-09-01 DIAGNOSIS — Z85828 Personal history of other malignant neoplasm of skin: Secondary | ICD-10-CM | POA: Diagnosis not present

## 2018-09-01 DIAGNOSIS — L821 Other seborrheic keratosis: Secondary | ICD-10-CM | POA: Diagnosis not present

## 2018-09-01 DIAGNOSIS — C44329 Squamous cell carcinoma of skin of other parts of face: Secondary | ICD-10-CM | POA: Diagnosis not present

## 2018-09-22 DIAGNOSIS — E039 Hypothyroidism, unspecified: Secondary | ICD-10-CM | POA: Diagnosis not present

## 2018-09-22 DIAGNOSIS — I10 Essential (primary) hypertension: Secondary | ICD-10-CM | POA: Diagnosis not present

## 2018-09-22 DIAGNOSIS — E559 Vitamin D deficiency, unspecified: Secondary | ICD-10-CM | POA: Diagnosis not present

## 2018-09-26 DIAGNOSIS — J45998 Other asthma: Secondary | ICD-10-CM | POA: Diagnosis not present

## 2018-09-26 DIAGNOSIS — E871 Hypo-osmolality and hyponatremia: Secondary | ICD-10-CM | POA: Diagnosis not present

## 2018-09-26 DIAGNOSIS — K7689 Other specified diseases of liver: Secondary | ICD-10-CM | POA: Diagnosis not present

## 2018-09-26 DIAGNOSIS — I10 Essential (primary) hypertension: Secondary | ICD-10-CM | POA: Diagnosis not present

## 2018-10-04 DIAGNOSIS — I10 Essential (primary) hypertension: Secondary | ICD-10-CM | POA: Diagnosis not present

## 2018-11-25 DIAGNOSIS — Z853 Personal history of malignant neoplasm of breast: Secondary | ICD-10-CM | POA: Diagnosis not present

## 2018-11-25 DIAGNOSIS — Z1231 Encounter for screening mammogram for malignant neoplasm of breast: Secondary | ICD-10-CM | POA: Diagnosis not present

## 2018-11-29 DIAGNOSIS — H04123 Dry eye syndrome of bilateral lacrimal glands: Secondary | ICD-10-CM | POA: Diagnosis not present

## 2018-11-29 DIAGNOSIS — H40012 Open angle with borderline findings, low risk, left eye: Secondary | ICD-10-CM | POA: Diagnosis not present

## 2018-11-29 DIAGNOSIS — H3562 Retinal hemorrhage, left eye: Secondary | ICD-10-CM | POA: Diagnosis not present

## 2018-11-29 DIAGNOSIS — H401411 Capsular glaucoma with pseudoexfoliation of lens, right eye, mild stage: Secondary | ICD-10-CM | POA: Diagnosis not present

## 2018-12-20 DIAGNOSIS — C50919 Malignant neoplasm of unspecified site of unspecified female breast: Secondary | ICD-10-CM | POA: Diagnosis not present

## 2018-12-20 DIAGNOSIS — R413 Other amnesia: Secondary | ICD-10-CM | POA: Diagnosis not present

## 2018-12-20 DIAGNOSIS — E871 Hypo-osmolality and hyponatremia: Secondary | ICD-10-CM | POA: Diagnosis not present

## 2018-12-20 DIAGNOSIS — I10 Essential (primary) hypertension: Secondary | ICD-10-CM | POA: Diagnosis not present

## 2019-01-13 ENCOUNTER — Other Ambulatory Visit: Payer: Self-pay

## 2019-01-31 DIAGNOSIS — E785 Hyperlipidemia, unspecified: Secondary | ICD-10-CM | POA: Diagnosis not present

## 2019-01-31 DIAGNOSIS — I10 Essential (primary) hypertension: Secondary | ICD-10-CM | POA: Diagnosis not present

## 2019-01-31 DIAGNOSIS — Z0000 Encounter for general adult medical examination without abnormal findings: Secondary | ICD-10-CM | POA: Diagnosis not present

## 2019-02-06 DIAGNOSIS — E785 Hyperlipidemia, unspecified: Secondary | ICD-10-CM | POA: Diagnosis not present

## 2019-02-06 DIAGNOSIS — E039 Hypothyroidism, unspecified: Secondary | ICD-10-CM | POA: Diagnosis not present

## 2019-02-06 DIAGNOSIS — E7800 Pure hypercholesterolemia, unspecified: Secondary | ICD-10-CM | POA: Diagnosis not present

## 2019-02-06 DIAGNOSIS — I10 Essential (primary) hypertension: Secondary | ICD-10-CM | POA: Diagnosis not present

## 2019-02-06 DIAGNOSIS — R7303 Prediabetes: Secondary | ICD-10-CM | POA: Diagnosis not present

## 2019-02-06 DIAGNOSIS — N390 Urinary tract infection, site not specified: Secondary | ICD-10-CM | POA: Diagnosis not present

## 2019-02-06 DIAGNOSIS — E559 Vitamin D deficiency, unspecified: Secondary | ICD-10-CM | POA: Diagnosis not present

## 2019-02-06 DIAGNOSIS — R05 Cough: Secondary | ICD-10-CM | POA: Diagnosis not present

## 2019-02-06 DIAGNOSIS — Z780 Asymptomatic menopausal state: Secondary | ICD-10-CM | POA: Diagnosis not present

## 2019-03-31 DIAGNOSIS — H52223 Regular astigmatism, bilateral: Secondary | ICD-10-CM | POA: Diagnosis not present

## 2019-03-31 DIAGNOSIS — H524 Presbyopia: Secondary | ICD-10-CM | POA: Diagnosis not present

## 2019-03-31 DIAGNOSIS — H3562 Retinal hemorrhage, left eye: Secondary | ICD-10-CM | POA: Diagnosis not present

## 2019-03-31 DIAGNOSIS — H40012 Open angle with borderline findings, low risk, left eye: Secondary | ICD-10-CM | POA: Diagnosis not present

## 2019-03-31 DIAGNOSIS — H401411 Capsular glaucoma with pseudoexfoliation of lens, right eye, mild stage: Secondary | ICD-10-CM | POA: Diagnosis not present

## 2019-03-31 DIAGNOSIS — H04123 Dry eye syndrome of bilateral lacrimal glands: Secondary | ICD-10-CM | POA: Diagnosis not present

## 2019-04-06 DIAGNOSIS — I10 Essential (primary) hypertension: Secondary | ICD-10-CM | POA: Diagnosis not present

## 2019-04-06 DIAGNOSIS — L0390 Cellulitis, unspecified: Secondary | ICD-10-CM | POA: Diagnosis not present

## 2019-04-11 DIAGNOSIS — L570 Actinic keratosis: Secondary | ICD-10-CM | POA: Diagnosis not present

## 2019-04-11 DIAGNOSIS — Z85828 Personal history of other malignant neoplasm of skin: Secondary | ICD-10-CM | POA: Diagnosis not present

## 2019-04-11 DIAGNOSIS — D1801 Hemangioma of skin and subcutaneous tissue: Secondary | ICD-10-CM | POA: Diagnosis not present

## 2019-04-11 DIAGNOSIS — L821 Other seborrheic keratosis: Secondary | ICD-10-CM | POA: Diagnosis not present

## 2019-04-11 DIAGNOSIS — C44329 Squamous cell carcinoma of skin of other parts of face: Secondary | ICD-10-CM | POA: Diagnosis not present

## 2019-04-11 DIAGNOSIS — L814 Other melanin hyperpigmentation: Secondary | ICD-10-CM | POA: Diagnosis not present

## 2019-04-18 ENCOUNTER — Ambulatory Visit (INDEPENDENT_AMBULATORY_CARE_PROVIDER_SITE_OTHER): Payer: Medicare Other | Admitting: Podiatry

## 2019-04-18 ENCOUNTER — Other Ambulatory Visit: Payer: Self-pay

## 2019-04-18 DIAGNOSIS — M79675 Pain in left toe(s): Secondary | ICD-10-CM

## 2019-04-18 DIAGNOSIS — B351 Tinea unguium: Secondary | ICD-10-CM | POA: Diagnosis not present

## 2019-04-18 DIAGNOSIS — L03039 Cellulitis of unspecified toe: Secondary | ICD-10-CM

## 2019-04-18 DIAGNOSIS — M79674 Pain in right toe(s): Secondary | ICD-10-CM | POA: Diagnosis not present

## 2019-04-18 NOTE — Patient Instructions (Signed)

## 2019-04-24 NOTE — Progress Notes (Signed)
Subjective:   Patient ID: Nancy Webb, female   DOB: 85 y.o.   MRN: [id number]   HPI 85 year old female presents the office today for concerns of possible infection of the right big toe of the nails are very thick, discolored and elongated causing pain.  She was recently started on doxycycline for possible infection right big toe nail.  She said the nail became very thick and she noticed it getting red on the base and abdomen she was started on antibiotics.   Review of Systems  All other systems reviewed and are negative.  Past Medical History:  Diagnosis Date  . Adenomatous colon polyp 02/1993  . Allergic rhinitis   . Arthritis   . Asthma   . Breast cancer ([place])    Right breast  . Diverticulosis   . GERD (gastroesophageal reflux disease)   . Gout   . Hiatal hernia   . Hyperlipidemia   . Hypertension   . IBS (irritable bowel syndrome)   . Internal hemorrhoids   . Varicose veins    superificial thrombophlebitis right leg  09-2013    Past Surgical History:  Procedure Laterality Date  . CATARACT EXTRACTION, BILATERAL  08/2012, 09/2012  . CYSTECTOMY Right    [place]  . ENDOVENOUS ABLATION SAPHENOUS VEIN W/ LASER Right 07/21/2018   endovenous laser ablation right greater saphenous vein by Deitra Mayo MD   . MASTECTOMY Right 1987     Current Outpatient Medications:  .  albuterol (PROAIR HFA) 108 (90 Base) MCG/ACT inhaler, ProAir HFA 90 mcg/actuation aerosol inhaler, Disp: , Rfl:  .  amLODipine (NORVASC) 5 MG tablet, Take 5 mg by mouth daily. , Disp: , Rfl:  .  aspirin 81 MG chewable tablet, aspirin 81 mg chewable tablet  Chew 1 tablet every day by oral route., Disp: , Rfl:  .  Besifloxacin HCl (BESIVANCE) 0.6 % SUSP, Besivance 0.6 % eye drops,suspension  STARTING 1 DAY BEFORE SURGERY, instill 1 drop into right eye four times a day until finished, Disp: , Rfl:  .  Bromfenac Sodium (PROLENSA) 0.07 % SOLN, Prolensa 0.07 % eye drops  START THIS DROP THE DAY BEFORE SURGERY  then instill 1 drop once daily into left eye until finished, Disp: , Rfl:  .  cephALEXin (KEFLEX) 500 MG capsule, cephalexin 500 mg capsule, Disp: , Rfl:  .  colchicine (COLCRYS) 0.6 MG tablet, Colcrys 0.6 mg tablet, Disp: , Rfl:  .  doxycycline (VIBRA-TABS) 100 MG tablet, Take 100 mg by mouth 2 (two) times daily., Disp: , Rfl:  .  HYDROcodone-acetaminophen (NORCO/VICODIN) 5-325 MG tablet, hydrocodone 5 mg-acetaminophen 325 mg tablet, Disp: , Rfl:  .  hyoscyamine (LEVSIN SL) 0.125 MG SL tablet, Place 1 tablet (0.125 mg total) under the tongue 4 (four) times daily as needed., Disp: 100 tablet, Rfl: 11 .  ibuprofen (ADVIL) 800 MG tablet, Take 800 mg by mouth every 8 (eight) hours., Disp: , Rfl:  .  irbesartan (AVAPRO) 300 MG tablet, Take 300 mg by mouth daily. , Disp: , Rfl:  .  latanoprost (XALATAN) 0.005 % ophthalmic solution, Place 1 drop into the right eye at bedtime., Disp: , Rfl:  .  methocarbamol (ROBAXIN) 500 MG tablet, Take 500 mg by mouth at bedtime as needed. , Disp: , Rfl:  .  montelukast (SINGULAIR) 10 MG tablet, Take 10 mg by mouth daily. , Disp: , Rfl:  .  omeprazole (PRILOSEC) 40 MG capsule, Take 1 capsule (40 mg total) by mouth daily., Disp: 30 capsule, Rfl:  11 .  VESICARE 5 MG tablet, Take 2.5 mg by mouth daily. , Disp: , Rfl:   Allergies  Allergen Reactions  . Loratadine Other (See Comments)    REACTION: headache REACTION: headache           Objective:  Physical Exam  General: AAO x3, NAD  Dermatological: Nails are significantly hypertrophic, dystrophic, brittle, discolored, elongated 10.  On the right hallux toenail almost the entire toenail did come off.  There is localized edema and erythema on the proximal nail borders with clear drainage underneath the nail of the nail removed there is no deeper signs of infection.  Small granular wound present.  No purulence.  No surrounding redness or drainage. Tenderness nails 1-5 bilaterally. No open lesions or pre-ulcerative  lesions are identified today.   Vascular: Dorsalis Pedis artery and Posterior Tibial artery pedal pulses are 2/4 bilateral with immedate capillary fill time. There is no pain with calf compression, swelling, warmth, erythema.   Neruologic: Grossly intact via light touch bilateral.    Musculoskeletal:  No pain, crepitus, or limitation noted with foot and ankle range of motion bilateral.     Assessment:   Symptomatic onychomycosis right toenail infection    Plan:  -Treatment options discussed including all alternatives, risks, and complications -Etiology of symptoms were discussed -I debrided nails times now with any complications or bleeding.  Also debrided the right hallux toenail of the entire nail did come off on its own.  Finish course of doxycycline.  Recommend Epson salt soaks daily.  Antibiotic ointment and a bandage daily.  Small piece of nail still intact as needed to remove this but will likely come off on its own.   RTC 1-2 weeks or sooner if needed for nail check and then plan to have her come in for routine care  Trula Slade DPM

## 2019-05-01 DIAGNOSIS — R7303 Prediabetes: Secondary | ICD-10-CM | POA: Diagnosis not present

## 2019-05-01 DIAGNOSIS — I10 Essential (primary) hypertension: Secondary | ICD-10-CM | POA: Diagnosis not present

## 2019-05-01 DIAGNOSIS — E039 Hypothyroidism, unspecified: Secondary | ICD-10-CM | POA: Diagnosis not present

## 2019-05-02 ENCOUNTER — Other Ambulatory Visit: Payer: Self-pay

## 2019-05-02 ENCOUNTER — Ambulatory Visit (INDEPENDENT_AMBULATORY_CARE_PROVIDER_SITE_OTHER): Payer: Medicare Other | Admitting: Podiatry

## 2019-05-02 ENCOUNTER — Encounter: Payer: Self-pay | Admitting: Podiatry

## 2019-05-02 DIAGNOSIS — M2041 Other hammer toe(s) (acquired), right foot: Secondary | ICD-10-CM | POA: Diagnosis not present

## 2019-05-02 DIAGNOSIS — L03039 Cellulitis of unspecified toe: Secondary | ICD-10-CM

## 2019-05-05 DIAGNOSIS — M810 Age-related osteoporosis without current pathological fracture: Secondary | ICD-10-CM | POA: Diagnosis not present

## 2019-05-05 DIAGNOSIS — E785 Hyperlipidemia, unspecified: Secondary | ICD-10-CM | POA: Diagnosis not present

## 2019-05-05 DIAGNOSIS — I10 Essential (primary) hypertension: Secondary | ICD-10-CM | POA: Diagnosis not present

## 2019-05-05 DIAGNOSIS — R05 Cough: Secondary | ICD-10-CM | POA: Diagnosis not present

## 2019-05-05 DIAGNOSIS — E039 Hypothyroidism, unspecified: Secondary | ICD-10-CM | POA: Diagnosis not present

## 2019-05-07 NOTE — Progress Notes (Signed)
Subjective: 85 year old female presents the office today for follow-up evaluation of infection in the right big toe nail.  She said that she is doing well and the redness had resolved.  She states the toe has been healing well but any complications and she has no pain.  A piece of nail that was still intact and follow-up.  She states that she has noticed a small red area in the top of her right second toe but denies any pain.  She has no other concerns. Denies any systemic complaints such as fevers, chills, nausea, vomiting. No acute changes since last appointment, and no other complaints at this time.   Objective: AAO x3, NAD DP/PT pulses palpable bilaterally, CRT less than 3 seconds The right hallux toenail site is healed.  No open sore.  There is no drainage or pus there is no surrounding erythema, ascending cellulitis.  No blood pressure crepitation.  There is no malodor.  Mild hammertoe contracture present right second toe with small area of erythema from rubbing with there is no skin breakdown. No pain with calf compression, swelling, warmth, erythema  Assessment: Healed nailbed right hallux; right second hammertoe  Plan: -All treatment options discussed with the patient including all alternatives, risks, complications.  -Right hallux appears to be healed.  Doing to keep Neosporin and a Band-Aid on the area during the day but she can leave it open at home around the house. -Offloading pad for the right second toe dispensed. -Patient encouraged to call the office with any questions, concerns, change in symptoms.   Return in about 9 weeks (around 07/04/2019).  Routine care  Trula Slade DPM

## 2019-07-04 ENCOUNTER — Ambulatory Visit (INDEPENDENT_AMBULATORY_CARE_PROVIDER_SITE_OTHER): Payer: Medicare Other | Admitting: Podiatry

## 2019-07-04 ENCOUNTER — Other Ambulatory Visit: Payer: Self-pay

## 2019-07-04 DIAGNOSIS — M79674 Pain in right toe(s): Secondary | ICD-10-CM

## 2019-07-04 DIAGNOSIS — B351 Tinea unguium: Secondary | ICD-10-CM

## 2019-07-04 DIAGNOSIS — M79675 Pain in left toe(s): Secondary | ICD-10-CM

## 2019-07-04 DIAGNOSIS — N951 Menopausal and female climacteric states: Secondary | ICD-10-CM | POA: Insufficient documentation

## 2019-07-04 DIAGNOSIS — N3281 Overactive bladder: Secondary | ICD-10-CM | POA: Insufficient documentation

## 2019-07-04 DIAGNOSIS — E663 Overweight: Secondary | ICD-10-CM | POA: Insufficient documentation

## 2019-07-04 DIAGNOSIS — E7800 Pure hypercholesterolemia, unspecified: Secondary | ICD-10-CM | POA: Insufficient documentation

## 2019-07-09 NOTE — Progress Notes (Signed)
Subjective: 86 y.o. returns the office today for painful, elongated, thickened toenails which she cannot trim herself . Denies any redness or drainage around the nails. Denies any acute changes since last appointment and no new complaints today. Denies any systemic complaints such as fevers, chills, nausea, vomiting.   PCP: Jani Gravel, MD Last seen 02/06/2019  Objective: AAO 3, NAD DP/PT pulses palpable, CRT less than 3 seconds Nails hypertrophic, dystrophic, elongated, brittle, discolored 10. There is tenderness overlying the nails 1-5 bilaterally. There is no surrounding erythema or drainage along the nail sites. No open lesions or pre-ulcerative lesions are identified. No other areas of tenderness bilateral lower extremities. No overlying edema, erythema, increased warmth. No pain with calf compression, swelling, warmth, erythema.  Assessment: Patient presents with symptomatic onychomycosis  Plan: -Treatment options including alternatives, risks, complications were discussed -Nails sharply debrided 10 without complication/bleeding. -Discussed daily foot inspection. If there are any changes, to call the office immediately.  -Follow-up in 3 months or sooner if any problems are to arise. In the meantime, encouraged to call the office with any questions, concerns, changes symptoms.  Celesta Gentile, DPM

## 2019-07-11 DIAGNOSIS — M810 Age-related osteoporosis without current pathological fracture: Secondary | ICD-10-CM | POA: Diagnosis not present

## 2019-08-15 DIAGNOSIS — M810 Age-related osteoporosis without current pathological fracture: Secondary | ICD-10-CM | POA: Diagnosis not present

## 2019-08-28 DIAGNOSIS — E039 Hypothyroidism, unspecified: Secondary | ICD-10-CM | POA: Diagnosis not present

## 2019-08-28 DIAGNOSIS — I10 Essential (primary) hypertension: Secondary | ICD-10-CM | POA: Diagnosis not present

## 2019-09-08 DIAGNOSIS — I10 Essential (primary) hypertension: Secondary | ICD-10-CM | POA: Diagnosis not present

## 2019-09-08 DIAGNOSIS — M810 Age-related osteoporosis without current pathological fracture: Secondary | ICD-10-CM | POA: Diagnosis not present

## 2019-09-08 DIAGNOSIS — M109 Gout, unspecified: Secondary | ICD-10-CM | POA: Diagnosis not present

## 2019-09-08 DIAGNOSIS — E039 Hypothyroidism, unspecified: Secondary | ICD-10-CM | POA: Diagnosis not present

## 2019-09-08 DIAGNOSIS — C50919 Malignant neoplasm of unspecified site of unspecified female breast: Secondary | ICD-10-CM | POA: Diagnosis not present

## 2019-09-08 DIAGNOSIS — K219 Gastro-esophageal reflux disease without esophagitis: Secondary | ICD-10-CM | POA: Diagnosis not present

## 2019-09-08 DIAGNOSIS — E785 Hyperlipidemia, unspecified: Secondary | ICD-10-CM | POA: Diagnosis not present

## 2019-09-08 DIAGNOSIS — Z780 Asymptomatic menopausal state: Secondary | ICD-10-CM | POA: Diagnosis not present

## 2019-09-08 DIAGNOSIS — J45998 Other asthma: Secondary | ICD-10-CM | POA: Diagnosis not present

## 2019-09-08 DIAGNOSIS — K580 Irritable bowel syndrome with diarrhea: Secondary | ICD-10-CM | POA: Diagnosis not present

## 2019-09-08 DIAGNOSIS — E559 Vitamin D deficiency, unspecified: Secondary | ICD-10-CM | POA: Diagnosis not present

## 2019-09-08 DIAGNOSIS — R413 Other amnesia: Secondary | ICD-10-CM | POA: Diagnosis not present

## 2019-09-12 ENCOUNTER — Other Ambulatory Visit: Payer: Self-pay

## 2019-09-12 ENCOUNTER — Ambulatory Visit (INDEPENDENT_AMBULATORY_CARE_PROVIDER_SITE_OTHER): Payer: Medicare Other | Admitting: Podiatry

## 2019-09-12 DIAGNOSIS — M79674 Pain in right toe(s): Secondary | ICD-10-CM | POA: Diagnosis not present

## 2019-09-12 DIAGNOSIS — M79675 Pain in left toe(s): Secondary | ICD-10-CM

## 2019-09-12 DIAGNOSIS — B351 Tinea unguium: Secondary | ICD-10-CM

## 2019-09-12 NOTE — Progress Notes (Signed)
Subjective: 86 y.o. returns the office today for painful, elongated, thickened toenails which she cannot trim herself . Denies any redness or drainage around the nails.  Denies any systemic complaints such as fevers, chills, nausea, vomiting.   PCP: Jani Gravel, MD Last seen 02/06/2019  Objective: AAO 3, NAD DP/PT pulses palpable, CRT less than 3 seconds Nails hypertrophic, dystrophic, elongated, brittle, discolored 10. There is tenderness overlying the nails 1-5 bilaterally. There is no surrounding erythema or drainage along the nail sites. No open lesions or pre-ulcerative lesions are identified. No other areas of tenderness bilateral lower extremities. No overlying edema, erythema, increased warmth. No pain with calf compression, swelling, warmth, erythema.  Assessment: Patient presents with symptomatic onychomycosis  Plan: -Treatment options including alternatives, risks, complications were discussed -Nails sharply debrided 10 without any bleeding. The right 2nd digit lateral nail border did split during debridement and a piece of ingrown nail came out. There was no bleeding. Recommended antibiotic ointment daily for the next few days. Monitor for any issue.s  -Discussed daily foot inspection. If there are any changes, to call the office immediately.  -Follow-up in 3 months or sooner if any problems are to arise. In the meantime, encouraged to call the office with any questions, concerns, changes symptoms.  Celesta Gentile, DPM

## 2019-10-03 DIAGNOSIS — H401411 Capsular glaucoma with pseudoexfoliation of lens, right eye, mild stage: Secondary | ICD-10-CM | POA: Diagnosis not present

## 2019-10-03 DIAGNOSIS — H40012 Open angle with borderline findings, low risk, left eye: Secondary | ICD-10-CM | POA: Diagnosis not present

## 2019-10-10 DIAGNOSIS — M810 Age-related osteoporosis without current pathological fracture: Secondary | ICD-10-CM | POA: Diagnosis not present

## 2019-10-17 DIAGNOSIS — Z85828 Personal history of other malignant neoplasm of skin: Secondary | ICD-10-CM | POA: Diagnosis not present

## 2019-10-17 DIAGNOSIS — L821 Other seborrheic keratosis: Secondary | ICD-10-CM | POA: Diagnosis not present

## 2019-10-17 DIAGNOSIS — L570 Actinic keratosis: Secondary | ICD-10-CM | POA: Diagnosis not present

## 2019-11-10 DIAGNOSIS — M810 Age-related osteoporosis without current pathological fracture: Secondary | ICD-10-CM | POA: Diagnosis not present

## 2019-11-14 ENCOUNTER — Ambulatory Visit: Payer: Medicare Other | Admitting: Podiatry

## 2020-01-15 ENCOUNTER — Ambulatory Visit (INDEPENDENT_AMBULATORY_CARE_PROVIDER_SITE_OTHER): Payer: Medicare Other | Admitting: Podiatry

## 2020-01-15 ENCOUNTER — Other Ambulatory Visit: Payer: Self-pay

## 2020-01-15 DIAGNOSIS — B351 Tinea unguium: Secondary | ICD-10-CM

## 2020-01-15 DIAGNOSIS — M79675 Pain in left toe(s): Secondary | ICD-10-CM

## 2020-01-15 DIAGNOSIS — M79674 Pain in right toe(s): Secondary | ICD-10-CM | POA: Diagnosis not present

## 2020-01-18 NOTE — Progress Notes (Signed)
Subjective: 86 y.o. returns the office today for painful, elongated, thickened toenails which she cannot trim herself . Denies any redness or drainage around the nails.  Denies any systemic complaints such as fevers, chills, nausea, vomiting.   PCP: Jani Gravel, MD Last seen 09/08/2019  Objective: AAO 3, NAD DP/PT pulses palpable, CRT less than 3 seconds Nails hypertrophic, dystrophic, elongated, brittle, discolored 10. There is tenderness overlying the nails 1-5 bilaterally. There is no surrounding erythema or drainage along the nail sites. No open lesions or pre-ulcerative lesions are identified. No pain with calf compression, swelling, warmth, erythema.  Assessment: Patient presents with symptomatic onychomycosis  Plan: -Treatment options including alternatives, risks, complications were discussed -Nails sharply debrided 10 without any bleeding.  -Discussed daily foot inspection. If there are any changes, to call the office immediately.  -Follow-up in 3 months or sooner if any problems are to arise. In the meantime, encouraged to call the office with any questions, concerns, changes symptoms.  Celesta Gentile, DPM

## 2020-01-30 DIAGNOSIS — E039 Hypothyroidism, unspecified: Secondary | ICD-10-CM | POA: Diagnosis not present

## 2020-01-30 DIAGNOSIS — E559 Vitamin D deficiency, unspecified: Secondary | ICD-10-CM | POA: Diagnosis not present

## 2020-01-30 DIAGNOSIS — I10 Essential (primary) hypertension: Secondary | ICD-10-CM | POA: Diagnosis not present

## 2020-01-30 DIAGNOSIS — R739 Hyperglycemia, unspecified: Secondary | ICD-10-CM | POA: Diagnosis not present

## 2020-01-30 DIAGNOSIS — E7800 Pure hypercholesterolemia, unspecified: Secondary | ICD-10-CM | POA: Diagnosis not present

## 2020-01-30 DIAGNOSIS — M810 Age-related osteoporosis without current pathological fracture: Secondary | ICD-10-CM | POA: Diagnosis not present

## 2020-02-06 DIAGNOSIS — M109 Gout, unspecified: Secondary | ICD-10-CM | POA: Diagnosis not present

## 2020-02-06 DIAGNOSIS — J45998 Other asthma: Secondary | ICD-10-CM | POA: Diagnosis not present

## 2020-02-06 DIAGNOSIS — K219 Gastro-esophageal reflux disease without esophagitis: Secondary | ICD-10-CM | POA: Diagnosis not present

## 2020-02-06 DIAGNOSIS — E039 Hypothyroidism, unspecified: Secondary | ICD-10-CM | POA: Diagnosis not present

## 2020-02-06 DIAGNOSIS — E559 Vitamin D deficiency, unspecified: Secondary | ICD-10-CM | POA: Diagnosis not present

## 2020-02-06 DIAGNOSIS — Z0000 Encounter for general adult medical examination without abnormal findings: Secondary | ICD-10-CM | POA: Diagnosis not present

## 2020-02-06 DIAGNOSIS — K7689 Other specified diseases of liver: Secondary | ICD-10-CM | POA: Diagnosis not present

## 2020-02-06 DIAGNOSIS — E785 Hyperlipidemia, unspecified: Secondary | ICD-10-CM | POA: Diagnosis not present

## 2020-02-06 DIAGNOSIS — I10 Essential (primary) hypertension: Secondary | ICD-10-CM | POA: Diagnosis not present

## 2020-02-06 DIAGNOSIS — K580 Irritable bowel syndrome with diarrhea: Secondary | ICD-10-CM | POA: Diagnosis not present

## 2020-02-06 DIAGNOSIS — Z780 Asymptomatic menopausal state: Secondary | ICD-10-CM | POA: Diagnosis not present

## 2020-02-06 DIAGNOSIS — M810 Age-related osteoporosis without current pathological fracture: Secondary | ICD-10-CM | POA: Diagnosis not present

## 2020-03-05 DIAGNOSIS — M810 Age-related osteoporosis without current pathological fracture: Secondary | ICD-10-CM | POA: Diagnosis not present

## 2020-03-07 DIAGNOSIS — Z23 Encounter for immunization: Secondary | ICD-10-CM | POA: Diagnosis not present

## 2020-03-26 DIAGNOSIS — L821 Other seborrheic keratosis: Secondary | ICD-10-CM | POA: Diagnosis not present

## 2020-03-26 DIAGNOSIS — Z85828 Personal history of other malignant neoplasm of skin: Secondary | ICD-10-CM | POA: Diagnosis not present

## 2020-03-26 DIAGNOSIS — L720 Epidermal cyst: Secondary | ICD-10-CM | POA: Diagnosis not present

## 2020-03-26 DIAGNOSIS — L570 Actinic keratosis: Secondary | ICD-10-CM | POA: Diagnosis not present

## 2020-03-26 DIAGNOSIS — D0462 Carcinoma in situ of skin of left upper limb, including shoulder: Secondary | ICD-10-CM | POA: Diagnosis not present

## 2020-03-26 DIAGNOSIS — L814 Other melanin hyperpigmentation: Secondary | ICD-10-CM | POA: Diagnosis not present

## 2020-03-28 DIAGNOSIS — Z23 Encounter for immunization: Secondary | ICD-10-CM | POA: Diagnosis not present

## 2020-04-02 DIAGNOSIS — D3132 Benign neoplasm of left choroid: Secondary | ICD-10-CM | POA: Diagnosis not present

## 2020-04-02 DIAGNOSIS — H52223 Regular astigmatism, bilateral: Secondary | ICD-10-CM | POA: Diagnosis not present

## 2020-04-02 DIAGNOSIS — H04123 Dry eye syndrome of bilateral lacrimal glands: Secondary | ICD-10-CM | POA: Diagnosis not present

## 2020-04-02 DIAGNOSIS — H401411 Capsular glaucoma with pseudoexfoliation of lens, right eye, mild stage: Secondary | ICD-10-CM | POA: Diagnosis not present

## 2020-04-02 DIAGNOSIS — H524 Presbyopia: Secondary | ICD-10-CM | POA: Diagnosis not present

## 2020-04-02 DIAGNOSIS — H40012 Open angle with borderline findings, low risk, left eye: Secondary | ICD-10-CM | POA: Diagnosis not present

## 2020-04-08 DIAGNOSIS — M810 Age-related osteoporosis without current pathological fracture: Secondary | ICD-10-CM | POA: Diagnosis not present

## 2020-04-16 ENCOUNTER — Other Ambulatory Visit: Payer: Self-pay

## 2020-04-16 ENCOUNTER — Ambulatory Visit (INDEPENDENT_AMBULATORY_CARE_PROVIDER_SITE_OTHER): Payer: Medicare Other | Admitting: Podiatry

## 2020-04-16 DIAGNOSIS — B351 Tinea unguium: Secondary | ICD-10-CM | POA: Diagnosis not present

## 2020-04-16 DIAGNOSIS — M79674 Pain in right toe(s): Secondary | ICD-10-CM | POA: Diagnosis not present

## 2020-04-16 DIAGNOSIS — M79675 Pain in left toe(s): Secondary | ICD-10-CM

## 2020-04-17 NOTE — Progress Notes (Signed)
Subjective: 86 y.o. returns the office today for painful, elongated, thickened toenails which she cannot trim herself . Denies any redness or drainage around the nails.  No new concerns today. Denies any systemic complaints such as fevers, chills, nausea, vomiting.   PCP: Jani Gravel, MD Last seen 09/08/2019  Objective: AAO 3, NAD DP/PT pulses palpable, CRT less than 3 seconds Nails hypertrophic, dystrophic, elongated, brittle, discolored 10. There is tenderness overlying the nails 1-5 bilaterally. There is no surrounding erythema or drainage along the nail sites. No open lesions or pre-ulcerative lesions are identified. No pain with calf compression, swelling, warmth, erythema.  Assessment: Patient presents with symptomatic onychomycosis  Plan: -Treatment options including alternatives, risks, complications were discussed -Nails sharply debrided 10 without any bleeding.  -Discussed daily foot inspection. If there are any changes, to call the office immediately.  -Follow-up in 3 months or sooner if any problems are to arise. In the meantime, encouraged to call the office with any questions, concerns, changes symptoms.  Celesta Gentile, DPM

## 2020-06-18 DIAGNOSIS — E7800 Pure hypercholesterolemia, unspecified: Secondary | ICD-10-CM | POA: Diagnosis not present

## 2020-06-18 DIAGNOSIS — E039 Hypothyroidism, unspecified: Secondary | ICD-10-CM | POA: Diagnosis not present

## 2020-06-18 DIAGNOSIS — I10 Essential (primary) hypertension: Secondary | ICD-10-CM | POA: Diagnosis not present

## 2020-06-18 DIAGNOSIS — E559 Vitamin D deficiency, unspecified: Secondary | ICD-10-CM | POA: Diagnosis not present

## 2020-06-18 DIAGNOSIS — M810 Age-related osteoporosis without current pathological fracture: Secondary | ICD-10-CM | POA: Diagnosis not present

## 2020-06-18 DIAGNOSIS — R739 Hyperglycemia, unspecified: Secondary | ICD-10-CM | POA: Diagnosis not present

## 2020-06-18 DIAGNOSIS — Z0000 Encounter for general adult medical examination without abnormal findings: Secondary | ICD-10-CM | POA: Diagnosis not present

## 2020-06-28 DIAGNOSIS — Z853 Personal history of malignant neoplasm of breast: Secondary | ICD-10-CM | POA: Diagnosis not present

## 2020-06-28 DIAGNOSIS — J45998 Other asthma: Secondary | ICD-10-CM | POA: Diagnosis not present

## 2020-06-28 DIAGNOSIS — I10 Essential (primary) hypertension: Secondary | ICD-10-CM | POA: Diagnosis not present

## 2020-06-28 DIAGNOSIS — K219 Gastro-esophageal reflux disease without esophagitis: Secondary | ICD-10-CM | POA: Diagnosis not present

## 2020-06-28 DIAGNOSIS — N390 Urinary tract infection, site not specified: Secondary | ICD-10-CM | POA: Diagnosis not present

## 2020-06-28 DIAGNOSIS — M810 Age-related osteoporosis without current pathological fracture: Secondary | ICD-10-CM | POA: Diagnosis not present

## 2020-07-16 ENCOUNTER — Other Ambulatory Visit: Payer: Self-pay

## 2020-07-16 ENCOUNTER — Ambulatory Visit (INDEPENDENT_AMBULATORY_CARE_PROVIDER_SITE_OTHER): Payer: Medicare Other | Admitting: Podiatry

## 2020-07-16 DIAGNOSIS — M79674 Pain in right toe(s): Secondary | ICD-10-CM | POA: Diagnosis not present

## 2020-07-16 DIAGNOSIS — M79675 Pain in left toe(s): Secondary | ICD-10-CM | POA: Diagnosis not present

## 2020-07-16 DIAGNOSIS — B351 Tinea unguium: Secondary | ICD-10-CM | POA: Diagnosis not present

## 2020-07-19 DIAGNOSIS — R109 Unspecified abdominal pain: Secondary | ICD-10-CM | POA: Diagnosis not present

## 2020-07-19 DIAGNOSIS — M545 Low back pain, unspecified: Secondary | ICD-10-CM | POA: Diagnosis not present

## 2020-07-22 NOTE — Progress Notes (Signed)
Subjective: 87 y.o. returns the office today for painful, elongated, thickened toenails which she cannot trim herself . Denies any redness or drainage around the nails.  No new concerns today. Denies any systemic complaints such as fevers, chills, nausea, vomiting.   PCP: Jani Gravel, MD   Objective: AAO 3, NAD DP/PT pulses palpable, CRT less than 3 seconds Nails hypertrophic, dystrophic, elongated, brittle, discolored 10. There is tenderness overlying the nails 1-5 bilaterally. There is no surrounding erythema or drainage along the nail sites. No open lesions or pre-ulcerative lesions are identified. No pain with calf compression, swelling, warmth, erythema.  Assessment: Patient presents with symptomatic onychomycosis  Plan: -Treatment options including alternatives, risks, complications were discussed -Nails sharply debrided 10 without any bleeding.  -Discussed daily foot inspection. If there are any changes, to call the office immediately.  -Follow-up in 3 months or sooner if any problems are to arise. In the meantime, encouraged to call the office with any questions, concerns, changes symptoms.  Celesta Gentile, DPM

## 2020-07-24 DIAGNOSIS — M810 Age-related osteoporosis without current pathological fracture: Secondary | ICD-10-CM | POA: Diagnosis not present

## 2020-08-12 DIAGNOSIS — Z01419 Encounter for gynecological examination (general) (routine) without abnormal findings: Secondary | ICD-10-CM | POA: Diagnosis not present

## 2020-08-12 DIAGNOSIS — Z6824 Body mass index (BMI) 24.0-24.9, adult: Secondary | ICD-10-CM | POA: Diagnosis not present

## 2020-08-27 DIAGNOSIS — M810 Age-related osteoporosis without current pathological fracture: Secondary | ICD-10-CM | POA: Diagnosis not present

## 2020-09-10 DIAGNOSIS — Z85828 Personal history of other malignant neoplasm of skin: Secondary | ICD-10-CM | POA: Diagnosis not present

## 2020-09-10 DIAGNOSIS — S80812A Abrasion, left lower leg, initial encounter: Secondary | ICD-10-CM | POA: Diagnosis not present

## 2020-10-15 ENCOUNTER — Ambulatory Visit (INDEPENDENT_AMBULATORY_CARE_PROVIDER_SITE_OTHER): Payer: Medicare Other | Admitting: Podiatry

## 2020-10-15 ENCOUNTER — Other Ambulatory Visit: Payer: Self-pay

## 2020-10-15 DIAGNOSIS — M79674 Pain in right toe(s): Secondary | ICD-10-CM

## 2020-10-15 DIAGNOSIS — B351 Tinea unguium: Secondary | ICD-10-CM | POA: Diagnosis not present

## 2020-10-15 DIAGNOSIS — M79675 Pain in left toe(s): Secondary | ICD-10-CM

## 2020-10-21 NOTE — Progress Notes (Signed)
Subjective: 87 y.o. returns the office today for painful, elongated, thickened toenails which she cannot trim herself . Denies any redness or drainage around the nails.  No new concerns today.  PCP: Jani Gravel, MD   Objective: AAO 3, NAD DP/PT pulses palpable, CRT less than 3 seconds Nails hypertrophic, dystrophic, elongated, brittle, discolored 10. There is tenderness overlying the nails 1-5 bilaterally. There is no surrounding erythema or drainage along the nail sites. No open lesions or pre-ulcerative lesions are identified. No pain with calf compression, swelling, warmth, erythema.  Assessment: Patient presents with symptomatic onychomycosis  Plan: -Treatment options including alternatives, risks, complications were discussed -Nails sharply debrided 10 without any bleeding.  -Discussed daily foot inspection. If there are any changes, to call the office immediately.  -Follow-up in 3 months or sooner if any problems are to arise. In the meantime, encouraged to call the office with any questions, concerns, changes symptoms.  Celesta Gentile, DPM

## 2020-10-30 DIAGNOSIS — E559 Vitamin D deficiency, unspecified: Secondary | ICD-10-CM | POA: Diagnosis not present

## 2020-10-30 DIAGNOSIS — I10 Essential (primary) hypertension: Secondary | ICD-10-CM | POA: Diagnosis not present

## 2020-10-30 DIAGNOSIS — M810 Age-related osteoporosis without current pathological fracture: Secondary | ICD-10-CM | POA: Diagnosis not present

## 2020-11-12 DIAGNOSIS — H524 Presbyopia: Secondary | ICD-10-CM | POA: Diagnosis not present

## 2020-11-12 DIAGNOSIS — H52223 Regular astigmatism, bilateral: Secondary | ICD-10-CM | POA: Diagnosis not present

## 2020-11-12 DIAGNOSIS — H40012 Open angle with borderline findings, low risk, left eye: Secondary | ICD-10-CM | POA: Diagnosis not present

## 2020-11-12 DIAGNOSIS — H04123 Dry eye syndrome of bilateral lacrimal glands: Secondary | ICD-10-CM | POA: Diagnosis not present

## 2020-11-13 DIAGNOSIS — R7303 Prediabetes: Secondary | ICD-10-CM | POA: Diagnosis not present

## 2020-11-13 DIAGNOSIS — E789 Disorder of lipoprotein metabolism, unspecified: Secondary | ICD-10-CM | POA: Diagnosis not present

## 2020-11-13 DIAGNOSIS — M810 Age-related osteoporosis without current pathological fracture: Secondary | ICD-10-CM | POA: Diagnosis not present

## 2020-11-13 DIAGNOSIS — K580 Irritable bowel syndrome with diarrhea: Secondary | ICD-10-CM | POA: Diagnosis not present

## 2020-11-13 DIAGNOSIS — J45998 Other asthma: Secondary | ICD-10-CM | POA: Diagnosis not present

## 2020-11-13 DIAGNOSIS — I129 Hypertensive chronic kidney disease with stage 1 through stage 4 chronic kidney disease, or unspecified chronic kidney disease: Secondary | ICD-10-CM | POA: Diagnosis not present

## 2020-11-13 DIAGNOSIS — E559 Vitamin D deficiency, unspecified: Secondary | ICD-10-CM | POA: Diagnosis not present

## 2020-11-13 DIAGNOSIS — M109 Gout, unspecified: Secondary | ICD-10-CM | POA: Diagnosis not present

## 2020-12-03 DIAGNOSIS — Z1231 Encounter for screening mammogram for malignant neoplasm of breast: Secondary | ICD-10-CM | POA: Diagnosis not present

## 2020-12-06 DIAGNOSIS — H401411 Capsular glaucoma with pseudoexfoliation of lens, right eye, mild stage: Secondary | ICD-10-CM | POA: Diagnosis not present

## 2020-12-06 DIAGNOSIS — H40012 Open angle with borderline findings, low risk, left eye: Secondary | ICD-10-CM | POA: Diagnosis not present

## 2021-01-21 ENCOUNTER — Ambulatory Visit (INDEPENDENT_AMBULATORY_CARE_PROVIDER_SITE_OTHER): Payer: Medicare Other | Admitting: Podiatry

## 2021-01-21 ENCOUNTER — Other Ambulatory Visit: Payer: Self-pay

## 2021-01-21 DIAGNOSIS — B351 Tinea unguium: Secondary | ICD-10-CM | POA: Diagnosis not present

## 2021-01-21 DIAGNOSIS — M79674 Pain in right toe(s): Secondary | ICD-10-CM

## 2021-01-21 DIAGNOSIS — M79675 Pain in left toe(s): Secondary | ICD-10-CM

## 2021-01-24 NOTE — Progress Notes (Signed)
Subjective: 87 y.o. returns the office today for painful, elongated, thickened toenails which she cannot trim herself . Denies any redness or drainage around the nails.  No new concerns today.  PCP: Jani Gravel, MD   Objective: AAO 3, NAD DP/PT pulses palpable, CRT less than 3 seconds Nails hypertrophic, dystrophic, elongated, brittle, discolored 10. There is tenderness overlying the nails 1-5 bilaterally. There is no surrounding erythema or drainage along the nail sites. Incurvation of the hallux toenails bilaterally without any signs of infection.  No open lesions or pre-ulcerative lesions are identified. No pain with calf compression, swelling, warmth, erythema.  Assessment: Patient presents with symptomatic onychomycosis; ingrown toenails  Plan: -Treatment options including alternatives, risks, complications were discussed -Nails sharply debrided 10 without any bleeding. Monitor the ingrown toenails for any signs of infection.  -Discussed daily foot inspection. If there are any changes, to call the office immediately.  -Follow-up in 3 months or sooner if any problems are to arise. In the meantime, encouraged to call the office with any questions, concerns, changes symptoms.  Celesta Gentile, DPM

## 2021-01-27 DIAGNOSIS — H04123 Dry eye syndrome of bilateral lacrimal glands: Secondary | ICD-10-CM | POA: Diagnosis not present

## 2021-01-27 DIAGNOSIS — H40002 Preglaucoma, unspecified, left eye: Secondary | ICD-10-CM | POA: Diagnosis not present

## 2021-01-27 DIAGNOSIS — H401421 Capsular glaucoma with pseudoexfoliation of lens, left eye, mild stage: Secondary | ICD-10-CM | POA: Diagnosis not present

## 2021-03-18 DIAGNOSIS — L821 Other seborrheic keratosis: Secondary | ICD-10-CM | POA: Diagnosis not present

## 2021-03-18 DIAGNOSIS — S80812D Abrasion, left lower leg, subsequent encounter: Secondary | ICD-10-CM | POA: Diagnosis not present

## 2021-03-18 DIAGNOSIS — Z85828 Personal history of other malignant neoplasm of skin: Secondary | ICD-10-CM | POA: Diagnosis not present

## 2021-03-25 DIAGNOSIS — M810 Age-related osteoporosis without current pathological fracture: Secondary | ICD-10-CM | POA: Diagnosis not present

## 2021-03-25 DIAGNOSIS — E789 Disorder of lipoprotein metabolism, unspecified: Secondary | ICD-10-CM | POA: Diagnosis not present

## 2021-03-25 DIAGNOSIS — M109 Gout, unspecified: Secondary | ICD-10-CM | POA: Diagnosis not present

## 2021-03-25 DIAGNOSIS — I129 Hypertensive chronic kidney disease with stage 1 through stage 4 chronic kidney disease, or unspecified chronic kidney disease: Secondary | ICD-10-CM | POA: Diagnosis not present

## 2021-03-25 DIAGNOSIS — K580 Irritable bowel syndrome with diarrhea: Secondary | ICD-10-CM | POA: Diagnosis not present

## 2021-03-25 DIAGNOSIS — R7303 Prediabetes: Secondary | ICD-10-CM | POA: Diagnosis not present

## 2021-03-25 DIAGNOSIS — E559 Vitamin D deficiency, unspecified: Secondary | ICD-10-CM | POA: Diagnosis not present

## 2021-04-01 DIAGNOSIS — J45998 Other asthma: Secondary | ICD-10-CM | POA: Diagnosis not present

## 2021-04-01 DIAGNOSIS — Z87440 Personal history of urinary (tract) infections: Secondary | ICD-10-CM | POA: Diagnosis not present

## 2021-04-01 DIAGNOSIS — E559 Vitamin D deficiency, unspecified: Secondary | ICD-10-CM | POA: Diagnosis not present

## 2021-04-01 DIAGNOSIS — I10 Essential (primary) hypertension: Secondary | ICD-10-CM | POA: Diagnosis not present

## 2021-04-01 DIAGNOSIS — Z0000 Encounter for general adult medical examination without abnormal findings: Secondary | ICD-10-CM | POA: Diagnosis not present

## 2021-04-01 DIAGNOSIS — M5136 Other intervertebral disc degeneration, lumbar region: Secondary | ICD-10-CM | POA: Diagnosis not present

## 2021-04-01 DIAGNOSIS — K580 Irritable bowel syndrome with diarrhea: Secondary | ICD-10-CM | POA: Diagnosis not present

## 2021-04-22 ENCOUNTER — Encounter: Payer: Self-pay | Admitting: Podiatry

## 2021-04-22 ENCOUNTER — Other Ambulatory Visit: Payer: Self-pay

## 2021-04-22 ENCOUNTER — Ambulatory Visit (INDEPENDENT_AMBULATORY_CARE_PROVIDER_SITE_OTHER): Payer: Medicare Other | Admitting: Podiatry

## 2021-04-22 DIAGNOSIS — M79675 Pain in left toe(s): Secondary | ICD-10-CM | POA: Diagnosis not present

## 2021-04-22 DIAGNOSIS — Z853 Personal history of malignant neoplasm of breast: Secondary | ICD-10-CM | POA: Insufficient documentation

## 2021-04-22 DIAGNOSIS — E559 Vitamin D deficiency, unspecified: Secondary | ICD-10-CM | POA: Insufficient documentation

## 2021-04-22 DIAGNOSIS — I129 Hypertensive chronic kidney disease with stage 1 through stage 4 chronic kidney disease, or unspecified chronic kidney disease: Secondary | ICD-10-CM | POA: Insufficient documentation

## 2021-04-22 DIAGNOSIS — B351 Tinea unguium: Secondary | ICD-10-CM | POA: Diagnosis not present

## 2021-04-22 DIAGNOSIS — M79674 Pain in right toe(s): Secondary | ICD-10-CM | POA: Diagnosis not present

## 2021-04-22 DIAGNOSIS — M810 Age-related osteoporosis without current pathological fracture: Secondary | ICD-10-CM | POA: Insufficient documentation

## 2021-04-22 DIAGNOSIS — E789 Disorder of lipoprotein metabolism, unspecified: Secondary | ICD-10-CM | POA: Insufficient documentation

## 2021-04-22 DIAGNOSIS — J45909 Unspecified asthma, uncomplicated: Secondary | ICD-10-CM | POA: Insufficient documentation

## 2021-04-22 DIAGNOSIS — M109 Gout, unspecified: Secondary | ICD-10-CM | POA: Insufficient documentation

## 2021-04-22 DIAGNOSIS — M5136 Other intervertebral disc degeneration, lumbar region: Secondary | ICD-10-CM | POA: Insufficient documentation

## 2021-04-22 NOTE — Progress Notes (Signed)
Subjective: 87 y.o. returns the office today for painful, elongated, thickened toenails which she cannot trim herself . Denies any redness or drainage around the nails.  No new concerns today. No open sores she has noticed. No injuries.   PCP: Jani Gravel, MD   Objective: AAO 3, NAD DP/PT pulses palpable, CRT less than 3 seconds Mild chronic appearing edema to the ankle with varicose veins present.  Nails hypertrophic, dystrophic, elongated, brittle, discolored 10. There is tenderness overlying the nails 1-5 bilaterally. There is no surrounding erythema or drainage along the nail sites. Incurvation of the hallux toenails bilaterally without any signs of infection.  No open lesions or pre-ulcerative lesions are identified. No pain with calf compression, swelling, warmth, erythema.  Assessment: Patient presents with symptomatic onychomycosis; ingrown toenails  Plan: -Treatment options including alternatives, risks, complications were discussed -Nails sharply debrided 10 without any bleeding. Monitor the ingrown toenails for any signs of infection.  -She has not been wearing compression stockings. Encouraged this and also elevation, limit salt intake.  -Discussed daily foot inspection. If there are any changes, to call the office immediately.  -Follow-up in 3 months or sooner if any problems are to arise. In the meantime, encouraged to call the office with any questions, concerns, changes symptoms.  Celesta Gentile, DPM

## 2021-04-24 ENCOUNTER — Ambulatory Visit: Payer: Medicare Other | Admitting: Podiatry

## 2021-06-24 DIAGNOSIS — H26492 Other secondary cataract, left eye: Secondary | ICD-10-CM | POA: Diagnosis not present

## 2021-06-24 DIAGNOSIS — H04123 Dry eye syndrome of bilateral lacrimal glands: Secondary | ICD-10-CM | POA: Diagnosis not present

## 2021-06-24 DIAGNOSIS — H401411 Capsular glaucoma with pseudoexfoliation of lens, right eye, mild stage: Secondary | ICD-10-CM | POA: Diagnosis not present

## 2021-06-24 DIAGNOSIS — H40022 Open angle with borderline findings, high risk, left eye: Secondary | ICD-10-CM | POA: Diagnosis not present

## 2021-07-29 ENCOUNTER — Other Ambulatory Visit: Payer: Self-pay

## 2021-07-29 ENCOUNTER — Ambulatory Visit (INDEPENDENT_AMBULATORY_CARE_PROVIDER_SITE_OTHER): Payer: Medicare Other | Admitting: Podiatry

## 2021-07-29 DIAGNOSIS — I10 Essential (primary) hypertension: Secondary | ICD-10-CM | POA: Diagnosis not present

## 2021-07-29 DIAGNOSIS — B351 Tinea unguium: Secondary | ICD-10-CM

## 2021-07-29 DIAGNOSIS — E559 Vitamin D deficiency, unspecified: Secondary | ICD-10-CM | POA: Diagnosis not present

## 2021-07-29 DIAGNOSIS — M79674 Pain in right toe(s): Secondary | ICD-10-CM

## 2021-07-29 DIAGNOSIS — M79675 Pain in left toe(s): Secondary | ICD-10-CM

## 2021-07-29 DIAGNOSIS — Z87440 Personal history of urinary (tract) infections: Secondary | ICD-10-CM | POA: Diagnosis not present

## 2021-08-02 NOTE — Progress Notes (Signed)
Subjective: 88 y.o. returns the office today for painful, elongated, thickened toenails which she cannot trim herself . Denies any redness or drainage around the nails.  No new concerns today. No open sores she has noticed. No injuries.   PCP: Jani Gravel, MD   Objective: AAO 3, NAD DP/PT pulses palpable, CRT less than 3 seconds Mild chronic appearing edema to the ankle with varicose veins present.  Nails hypertrophic, dystrophic, elongated, brittle, discolored 10. There is tenderness overlying the nails 1-5 bilaterally. There is no surrounding erythema or drainage along the nail sites. Incurvation of the hallux toenails bilaterally without any signs of infection today.  No open lesions or pre-ulcerative lesions are identified. No pain with calf compression, swelling, warmth, erythema.  Assessment: Patient presents with symptomatic onychomycosis; ingrown toenails  Plan: -Treatment options including alternatives, risks, complications were discussed -Nails sharply debrided 10 without any bleeding. Monitor the ingrown toenails for any signs of infection.  I was able to debride the symptomatic portion of the ingrown without any complications or bleeding today. -Discussed daily foot inspection. If there are any changes, to call the office immediately.  -Follow-up in 3 months or sooner if any problems are to arise. In the meantime, encouraged to call the office with any questions, concerns, changes symptoms.  Celesta Gentile, DPM

## 2021-08-05 DIAGNOSIS — K580 Irritable bowel syndrome with diarrhea: Secondary | ICD-10-CM | POA: Diagnosis not present

## 2021-08-05 DIAGNOSIS — Z87440 Personal history of urinary (tract) infections: Secondary | ICD-10-CM | POA: Diagnosis not present

## 2021-08-05 DIAGNOSIS — H6123 Impacted cerumen, bilateral: Secondary | ICD-10-CM | POA: Diagnosis not present

## 2021-08-05 DIAGNOSIS — E559 Vitamin D deficiency, unspecified: Secondary | ICD-10-CM | POA: Diagnosis not present

## 2021-08-05 DIAGNOSIS — I10 Essential (primary) hypertension: Secondary | ICD-10-CM | POA: Diagnosis not present

## 2021-09-09 DIAGNOSIS — Z85828 Personal history of other malignant neoplasm of skin: Secondary | ICD-10-CM | POA: Diagnosis not present

## 2021-09-09 DIAGNOSIS — L821 Other seborrheic keratosis: Secondary | ICD-10-CM | POA: Diagnosis not present

## 2021-09-09 DIAGNOSIS — L814 Other melanin hyperpigmentation: Secondary | ICD-10-CM | POA: Diagnosis not present

## 2021-10-07 ENCOUNTER — Ambulatory Visit (INDEPENDENT_AMBULATORY_CARE_PROVIDER_SITE_OTHER): Payer: Medicare Other | Admitting: Podiatry

## 2021-10-07 DIAGNOSIS — M79675 Pain in left toe(s): Secondary | ICD-10-CM

## 2021-10-07 DIAGNOSIS — B351 Tinea unguium: Secondary | ICD-10-CM

## 2021-10-07 DIAGNOSIS — M79674 Pain in right toe(s): Secondary | ICD-10-CM

## 2021-10-07 NOTE — Patient Instructions (Signed)
Keep a small amount of antibiotic ointment on the right big toenail. Monitor for any increase in pain, swelling, redness or any signs of infection. If anything were to change or worsen, let me know! ?

## 2021-10-14 NOTE — Progress Notes (Signed)
Subjective: ?88 y.o. returns the office today for painful, elongated, thickened toenails which she cannot trim herself . Denies any redness or drainage around the nails.  No new concerns today. No open sores she has noticed. No injuries.  ? ?Objective: ?AAO ?3, NAD ?DP/PT pulses palpable, CRT less than 3 seconds ?Mild chronic appearing edema to the ankle with varicose veins present.  ?Nails hypertrophic, dystrophic, elongated, brittle, discolored ?10. There is tenderness overlying the nails 1-5 bilaterally. There is no surrounding erythema or drainage along the nail sites. Incurvation of the hallux toenails bilaterally without any signs of infection today.  ?No open lesions or pre-ulcerative lesions are identified. ?No pain with calf compression, swelling, warmth, erythema. ? ?Assessment: ?Patient presents with symptomatic onychomycosis; ingrown toenails ? ?Plan: ?-Treatment options including alternatives, risks, complications were discussed ?-Nails sharply debrided ?10 without any complications.  Small mount of bleeding occurred during the debridement the right hallux toenail to the ingrown toenail.  No laceration was noted.  Recommend small antibiotic ointment and dressing daily.  Monitor for any signs or symptoms of infection. ?-Discussed daily foot inspection. If there are any changes, to call the office immediately.  ?-Follow-up in 3 months or sooner if any problems are to arise. In the meantime, encouraged to call the office with any questions, concerns, changes symptoms. ? ?Celesta Gentile, DPM ? ?

## 2021-11-03 DIAGNOSIS — H40022 Open angle with borderline findings, high risk, left eye: Secondary | ICD-10-CM | POA: Diagnosis not present

## 2021-11-03 DIAGNOSIS — H04123 Dry eye syndrome of bilateral lacrimal glands: Secondary | ICD-10-CM | POA: Diagnosis not present

## 2021-11-03 DIAGNOSIS — H401411 Capsular glaucoma with pseudoexfoliation of lens, right eye, mild stage: Secondary | ICD-10-CM | POA: Diagnosis not present

## 2021-11-07 DIAGNOSIS — M810 Age-related osteoporosis without current pathological fracture: Secondary | ICD-10-CM | POA: Diagnosis not present

## 2021-11-07 DIAGNOSIS — I10 Essential (primary) hypertension: Secondary | ICD-10-CM | POA: Diagnosis not present

## 2021-11-07 DIAGNOSIS — E559 Vitamin D deficiency, unspecified: Secondary | ICD-10-CM | POA: Diagnosis not present

## 2021-11-07 DIAGNOSIS — E789 Disorder of lipoprotein metabolism, unspecified: Secondary | ICD-10-CM | POA: Diagnosis not present

## 2021-11-07 DIAGNOSIS — E039 Hypothyroidism, unspecified: Secondary | ICD-10-CM | POA: Diagnosis not present

## 2021-11-14 DIAGNOSIS — K219 Gastro-esophageal reflux disease without esophagitis: Secondary | ICD-10-CM | POA: Diagnosis not present

## 2021-11-14 DIAGNOSIS — M5136 Other intervertebral disc degeneration, lumbar region: Secondary | ICD-10-CM | POA: Diagnosis not present

## 2021-11-14 DIAGNOSIS — Z87440 Personal history of urinary (tract) infections: Secondary | ICD-10-CM | POA: Diagnosis not present

## 2021-11-14 DIAGNOSIS — K580 Irritable bowel syndrome with diarrhea: Secondary | ICD-10-CM | POA: Diagnosis not present

## 2021-11-14 DIAGNOSIS — I129 Hypertensive chronic kidney disease with stage 1 through stage 4 chronic kidney disease, or unspecified chronic kidney disease: Secondary | ICD-10-CM | POA: Diagnosis not present

## 2021-11-14 DIAGNOSIS — E789 Disorder of lipoprotein metabolism, unspecified: Secondary | ICD-10-CM | POA: Diagnosis not present

## 2021-12-02 ENCOUNTER — Ambulatory Visit (INDEPENDENT_AMBULATORY_CARE_PROVIDER_SITE_OTHER): Payer: Medicare Other | Admitting: Podiatry

## 2021-12-02 DIAGNOSIS — M79675 Pain in left toe(s): Secondary | ICD-10-CM

## 2021-12-02 DIAGNOSIS — T148XXA Other injury of unspecified body region, initial encounter: Secondary | ICD-10-CM | POA: Diagnosis not present

## 2021-12-02 DIAGNOSIS — M79674 Pain in right toe(s): Secondary | ICD-10-CM | POA: Diagnosis not present

## 2021-12-02 DIAGNOSIS — L089 Local infection of the skin and subcutaneous tissue, unspecified: Secondary | ICD-10-CM | POA: Diagnosis not present

## 2021-12-02 DIAGNOSIS — B351 Tinea unguium: Secondary | ICD-10-CM | POA: Diagnosis not present

## 2021-12-02 MED ORDER — DOXYCYCLINE HYCLATE 100 MG PO TABS: 100.0000 mg | ORAL_TABLET | Freq: Two times a day (BID) | ORAL | 0 refills | Status: AC

## 2021-12-02 NOTE — Patient Instructions (Signed)
Keep the area of the blister on the right foot clean, dry.  Start the doxycycline, antibiotic.  If you notice any increase swelling, redness, drainage or any signs of infection please let us know immediately.

## 2021-12-09 ENCOUNTER — Ambulatory Visit: Payer: Medicare Other | Admitting: Podiatry

## 2022-01-27 DIAGNOSIS — Z1231 Encounter for screening mammogram for malignant neoplasm of breast: Secondary | ICD-10-CM | POA: Diagnosis not present

## 2022-02-03 ENCOUNTER — Ambulatory Visit (INDEPENDENT_AMBULATORY_CARE_PROVIDER_SITE_OTHER): Payer: Medicare Other | Admitting: Podiatry

## 2022-02-03 DIAGNOSIS — M79674 Pain in right toe(s): Secondary | ICD-10-CM | POA: Diagnosis not present

## 2022-02-03 DIAGNOSIS — M79675 Pain in left toe(s): Secondary | ICD-10-CM

## 2022-02-03 DIAGNOSIS — B351 Tinea unguium: Secondary | ICD-10-CM

## 2022-02-10 NOTE — Progress Notes (Signed)
Subjective: 88 y.o. returns the office today for painful, elongated, thickened toenails which she cannot trim herself . Denies any redness or drainage around the nails.  No blister formation.  No open lesions that she reports.    Objective: AAO 3, NAD DP/PT pulses palpable, CRT less than 3 seconds Mild chronic appearing edema to the ankle with varicose veins present.  Nails hypertrophic, dystrophic, elongated, brittle, discolored 10. There is tenderness overlying the nails 1-5 bilaterally. There is no surrounding erythema or drainage along the nail sites. Incurvation of the hallux toenails bilaterally without any signs of infection today.  No open lesions or pre-ulcerative lesions are identified. No pain with calf compression, swelling, warmth, erythema.  Assessment: Patient presents with symptomatic onychomycosis; blister with localized cellulitis  Plan: -Treatment options including alternatives, risks, complications were discussed -Nails sharply debrided 10 without any complications or bleeding -Daily foot inspection. -Discussed daily foot inspection. If there are any changes, to call the office immediately.    Nancy Webb, DPM

## 2022-03-09 DIAGNOSIS — H40022 Open angle with borderline findings, high risk, left eye: Secondary | ICD-10-CM | POA: Diagnosis not present

## 2022-03-09 DIAGNOSIS — H401411 Capsular glaucoma with pseudoexfoliation of lens, right eye, mild stage: Secondary | ICD-10-CM | POA: Diagnosis not present

## 2022-03-09 DIAGNOSIS — H04123 Dry eye syndrome of bilateral lacrimal glands: Secondary | ICD-10-CM | POA: Diagnosis not present

## 2022-03-24 DIAGNOSIS — D1801 Hemangioma of skin and subcutaneous tissue: Secondary | ICD-10-CM | POA: Diagnosis not present

## 2022-03-24 DIAGNOSIS — D3613 Benign neoplasm of peripheral nerves and autonomic nervous system of lower limb, including hip: Secondary | ICD-10-CM | POA: Diagnosis not present

## 2022-03-24 DIAGNOSIS — L814 Other melanin hyperpigmentation: Secondary | ICD-10-CM | POA: Diagnosis not present

## 2022-03-24 DIAGNOSIS — D692 Other nonthrombocytopenic purpura: Secondary | ICD-10-CM | POA: Diagnosis not present

## 2022-03-24 DIAGNOSIS — D2371 Other benign neoplasm of skin of right lower limb, including hip: Secondary | ICD-10-CM | POA: Diagnosis not present

## 2022-03-24 DIAGNOSIS — L570 Actinic keratosis: Secondary | ICD-10-CM | POA: Diagnosis not present

## 2022-03-24 DIAGNOSIS — D3612 Benign neoplasm of peripheral nerves and autonomic nervous system, upper limb, including shoulder: Secondary | ICD-10-CM | POA: Diagnosis not present

## 2022-03-24 DIAGNOSIS — L821 Other seborrheic keratosis: Secondary | ICD-10-CM | POA: Diagnosis not present

## 2022-03-24 DIAGNOSIS — D225 Melanocytic nevi of trunk: Secondary | ICD-10-CM | POA: Diagnosis not present

## 2022-03-24 DIAGNOSIS — Z85828 Personal history of other malignant neoplasm of skin: Secondary | ICD-10-CM | POA: Diagnosis not present

## 2022-03-24 DIAGNOSIS — D3617 Benign neoplasm of peripheral nerves and autonomic nervous system of trunk, unspecified: Secondary | ICD-10-CM | POA: Diagnosis not present

## 2022-03-30 ENCOUNTER — Ambulatory Visit (INDEPENDENT_AMBULATORY_CARE_PROVIDER_SITE_OTHER): Payer: Medicare Other | Admitting: Podiatry

## 2022-03-30 DIAGNOSIS — M79674 Pain in right toe(s): Secondary | ICD-10-CM | POA: Diagnosis not present

## 2022-03-30 DIAGNOSIS — M79675 Pain in left toe(s): Secondary | ICD-10-CM

## 2022-03-30 DIAGNOSIS — B351 Tinea unguium: Secondary | ICD-10-CM | POA: Diagnosis not present

## 2022-04-01 DIAGNOSIS — K219 Gastro-esophageal reflux disease without esophagitis: Secondary | ICD-10-CM | POA: Diagnosis not present

## 2022-04-01 DIAGNOSIS — K580 Irritable bowel syndrome with diarrhea: Secondary | ICD-10-CM | POA: Diagnosis not present

## 2022-04-01 DIAGNOSIS — I129 Hypertensive chronic kidney disease with stage 1 through stage 4 chronic kidney disease, or unspecified chronic kidney disease: Secondary | ICD-10-CM | POA: Diagnosis not present

## 2022-04-01 DIAGNOSIS — E789 Disorder of lipoprotein metabolism, unspecified: Secondary | ICD-10-CM | POA: Diagnosis not present

## 2022-04-01 DIAGNOSIS — Z87440 Personal history of urinary (tract) infections: Secondary | ICD-10-CM | POA: Diagnosis not present

## 2022-04-01 NOTE — Progress Notes (Signed)
Subjective: 88 y.o. returns the office today for painful, elongated, thickened toenails which she cannot trim herself . Denies any redness or drainage around the nails.  No open lesions that she reports.    Objective: AAO 3, NAD DP/PT pulses palpable, CRT less than 3 seconds Mild chronic appearing edema to the ankle with varicose veins present.  Nails hypertrophic, dystrophic, elongated, brittle, discolored 10. There is tenderness overlying the nails 1-5 bilaterally. There is no surrounding erythema or drainage along the nail sites. Incurvation of the hallux toenails bilaterally without any signs of infection today.  No open lesions or pre-ulcerative lesions are identified. No pain with calf compression, swelling, warmth, erythema.  Assessment: Patient presents with symptomatic onychomycosis; blister with localized cellulitis  Plan: -Treatment options including alternatives, risks, complications were discussed -Nails sharply debrided 10 without any complications or bleeding -Daily foot inspection. -Discussed daily foot inspection. If there are any changes, to call the office immediately.   *She will get her nails trimmed again next month.  Discussed that we will not be covered by insurance likely.  She understands this.  Celesta Gentile, DPM

## 2022-04-08 DIAGNOSIS — Z853 Personal history of malignant neoplasm of breast: Secondary | ICD-10-CM | POA: Diagnosis not present

## 2022-04-08 DIAGNOSIS — Z8709 Personal history of other diseases of the respiratory system: Secondary | ICD-10-CM | POA: Diagnosis not present

## 2022-04-08 DIAGNOSIS — Z0000 Encounter for general adult medical examination without abnormal findings: Secondary | ICD-10-CM | POA: Diagnosis not present

## 2022-04-08 DIAGNOSIS — R32 Unspecified urinary incontinence: Secondary | ICD-10-CM | POA: Diagnosis not present

## 2022-04-08 DIAGNOSIS — M5136 Other intervertebral disc degeneration, lumbar region: Secondary | ICD-10-CM | POA: Diagnosis not present

## 2022-04-08 DIAGNOSIS — Z87440 Personal history of urinary (tract) infections: Secondary | ICD-10-CM | POA: Diagnosis not present

## 2022-04-08 DIAGNOSIS — I129 Hypertensive chronic kidney disease with stage 1 through stage 4 chronic kidney disease, or unspecified chronic kidney disease: Secondary | ICD-10-CM | POA: Diagnosis not present

## 2022-04-08 DIAGNOSIS — K580 Irritable bowel syndrome with diarrhea: Secondary | ICD-10-CM | POA: Diagnosis not present

## 2022-04-08 DIAGNOSIS — K219 Gastro-esophageal reflux disease without esophagitis: Secondary | ICD-10-CM | POA: Diagnosis not present

## 2022-05-05 ENCOUNTER — Ambulatory Visit (INDEPENDENT_AMBULATORY_CARE_PROVIDER_SITE_OTHER): Payer: Medicare Other | Admitting: Podiatry

## 2022-05-05 DIAGNOSIS — B351 Tinea unguium: Secondary | ICD-10-CM

## 2022-05-05 DIAGNOSIS — M79674 Pain in right toe(s): Secondary | ICD-10-CM | POA: Diagnosis not present

## 2022-05-05 DIAGNOSIS — M79675 Pain in left toe(s): Secondary | ICD-10-CM | POA: Diagnosis not present

## 2022-05-12 NOTE — Progress Notes (Signed)
Subjective: 88 y.o. returns the office today for painful, elongated, thickened toenails which she cannot trim herself . Denies any redness or drainage around the nails.  No open lesions that she reports.    PCP: Jani Gravel, MD   Objective: AAO 3, NAD DP/PT pulses palpable, CRT less than 3 seconds Mild chronic appearing edema to the ankle with varicose veins present.  Nails hypertrophic, dystrophic, elongated, brittle, discolored 10. There is tenderness overlying the nails 1-5 bilaterally. There is no surrounding erythema or drainage along the nail sites. Incurvation of the hallux toenails bilaterally without any signs of infection today.  No open lesions noted. No pain with calf compression, swelling, warmth, erythema.  Assessment: Patient presents with symptomatic onychomycosis; blister with localized cellulitis  Plan: -Treatment options including alternatives, risks, complications were discussed -Nails sharply debrided 10 without any complications or bleeding -Daily foot inspection. -Discussed daily foot inspection. If there are any changes, to call the office immediately.   *She will get her nails trimmed again next month.  Discussed that we will not be covered by insurance likely.  She understands this.  Celesta Gentile, DPM

## 2022-07-07 ENCOUNTER — Ambulatory Visit (INDEPENDENT_AMBULATORY_CARE_PROVIDER_SITE_OTHER): Payer: Medicare Other | Admitting: Podiatry

## 2022-07-07 DIAGNOSIS — M79675 Pain in left toe(s): Secondary | ICD-10-CM

## 2022-07-07 DIAGNOSIS — B351 Tinea unguium: Secondary | ICD-10-CM | POA: Diagnosis not present

## 2022-07-07 DIAGNOSIS — M79674 Pain in right toe(s): Secondary | ICD-10-CM | POA: Diagnosis not present

## 2022-07-07 MED ORDER — MUPIROCIN 2 % EX OINT: 1.0000 | TOPICAL_OINTMENT | Freq: Two times a day (BID) | CUTANEOUS | 2 refills | Status: DC

## 2022-07-07 NOTE — Progress Notes (Unsigned)
[place] 

## 2022-07-20 DIAGNOSIS — D3132 Benign neoplasm of left choroid: Secondary | ICD-10-CM | POA: Diagnosis not present

## 2022-07-20 DIAGNOSIS — H40022 Open angle with borderline findings, high risk, left eye: Secondary | ICD-10-CM | POA: Diagnosis not present

## 2022-07-20 DIAGNOSIS — H26492 Other secondary cataract, left eye: Secondary | ICD-10-CM | POA: Diagnosis not present

## 2022-07-20 DIAGNOSIS — Z961 Presence of intraocular lens: Secondary | ICD-10-CM | POA: Diagnosis not present

## 2022-07-20 DIAGNOSIS — H401411 Capsular glaucoma with pseudoexfoliation of lens, right eye, mild stage: Secondary | ICD-10-CM | POA: Diagnosis not present

## 2022-08-11 DIAGNOSIS — M810 Age-related osteoporosis without current pathological fracture: Secondary | ICD-10-CM | POA: Diagnosis not present

## 2022-08-11 DIAGNOSIS — I129 Hypertensive chronic kidney disease with stage 1 through stage 4 chronic kidney disease, or unspecified chronic kidney disease: Secondary | ICD-10-CM | POA: Diagnosis not present

## 2022-08-11 DIAGNOSIS — Z87440 Personal history of urinary (tract) infections: Secondary | ICD-10-CM | POA: Diagnosis not present

## 2022-08-11 DIAGNOSIS — K219 Gastro-esophageal reflux disease without esophagitis: Secondary | ICD-10-CM | POA: Diagnosis not present

## 2022-08-17 DIAGNOSIS — Z01419 Encounter for gynecological examination (general) (routine) without abnormal findings: Secondary | ICD-10-CM | POA: Diagnosis not present

## 2022-08-17 DIAGNOSIS — Z6823 Body mass index (BMI) 23.0-23.9, adult: Secondary | ICD-10-CM | POA: Diagnosis not present

## 2022-08-17 DIAGNOSIS — Z853 Personal history of malignant neoplasm of breast: Secondary | ICD-10-CM | POA: Diagnosis not present

## 2022-08-18 DIAGNOSIS — R8271 Bacteriuria: Secondary | ICD-10-CM | POA: Diagnosis not present

## 2022-08-18 DIAGNOSIS — I129 Hypertensive chronic kidney disease with stage 1 through stage 4 chronic kidney disease, or unspecified chronic kidney disease: Secondary | ICD-10-CM | POA: Diagnosis not present

## 2022-08-18 DIAGNOSIS — K580 Irritable bowel syndrome with diarrhea: Secondary | ICD-10-CM | POA: Diagnosis not present

## 2022-08-18 DIAGNOSIS — E559 Vitamin D deficiency, unspecified: Secondary | ICD-10-CM | POA: Diagnosis not present

## 2022-08-18 DIAGNOSIS — M5136 Other intervertebral disc degeneration, lumbar region: Secondary | ICD-10-CM | POA: Diagnosis not present

## 2022-08-18 DIAGNOSIS — Z8709 Personal history of other diseases of the respiratory system: Secondary | ICD-10-CM | POA: Diagnosis not present

## 2022-08-18 DIAGNOSIS — R32 Unspecified urinary incontinence: Secondary | ICD-10-CM | POA: Diagnosis not present

## 2022-08-18 DIAGNOSIS — M810 Age-related osteoporosis without current pathological fracture: Secondary | ICD-10-CM | POA: Diagnosis not present

## 2022-08-26 DIAGNOSIS — H40022 Open angle with borderline findings, high risk, left eye: Secondary | ICD-10-CM | POA: Diagnosis not present

## 2022-08-26 DIAGNOSIS — H401411 Capsular glaucoma with pseudoexfoliation of lens, right eye, mild stage: Secondary | ICD-10-CM | POA: Diagnosis not present

## 2022-09-08 ENCOUNTER — Ambulatory Visit (INDEPENDENT_AMBULATORY_CARE_PROVIDER_SITE_OTHER): Payer: Medicare Other | Admitting: Podiatry

## 2022-09-08 DIAGNOSIS — M79675 Pain in left toe(s): Secondary | ICD-10-CM

## 2022-09-08 DIAGNOSIS — B351 Tinea unguium: Secondary | ICD-10-CM

## 2022-09-08 DIAGNOSIS — M79674 Pain in right toe(s): Secondary | ICD-10-CM

## 2022-09-13 NOTE — Progress Notes (Signed)
Subjective: Chief Complaint  Patient presents with   routine foot care     89 y.o. returns the office today for painful, elongated, thickened toenails which she cannot trim herself.  She denies any open sores.  No recent injuries or changes to her feet.  She has no new concerns today that she brings up.  PCP: Jani Gravel, MD   Objective: AAO 3, NAD DP/PT pulses palpable, CRT less than 3 seconds Mild chronic appearing edema to the ankle with varicose veins present.  Nails hypertrophic, dystrophic, elongated, brittle, discolored 10.  Base of the hallux toenails.  There is tenderness overlying the nails 1-5 bilaterally.  There is no surrounding erythema, drainage or pus or signs of infection today.  No pain with calf compression, swelling, warmth, erythema.  Assessment: Patient presents with symptomatic onychomycosis  Plan: -Treatment options including alternatives, risks, complications were discussed -Nails sharply debrided 10 without any complications or bleeding -Monitor ingrown toenails -Daily foot inspection. -Discussed daily foot inspection. If there are any changes, to call the office immediately.    Celesta Gentile, DPM

## 2022-10-15 DIAGNOSIS — M79602 Pain in left arm: Secondary | ICD-10-CM | POA: Diagnosis not present

## 2022-10-15 DIAGNOSIS — M25512 Pain in left shoulder: Secondary | ICD-10-CM | POA: Diagnosis not present

## 2022-10-15 DIAGNOSIS — S42202A Unspecified fracture of upper end of left humerus, initial encounter for closed fracture: Secondary | ICD-10-CM | POA: Diagnosis not present

## 2022-10-15 DIAGNOSIS — N390 Urinary tract infection, site not specified: Secondary | ICD-10-CM | POA: Diagnosis not present

## 2022-10-15 DIAGNOSIS — M25522 Pain in left elbow: Secondary | ICD-10-CM | POA: Diagnosis not present

## 2022-10-15 DIAGNOSIS — S42302A Unspecified fracture of shaft of humerus, left arm, initial encounter for closed fracture: Secondary | ICD-10-CM | POA: Diagnosis not present

## 2022-10-15 DIAGNOSIS — M25561 Pain in right knee: Secondary | ICD-10-CM | POA: Diagnosis not present

## 2022-10-15 DIAGNOSIS — R413 Other amnesia: Secondary | ICD-10-CM | POA: Diagnosis not present

## 2022-10-16 DIAGNOSIS — S42202D Unspecified fracture of upper end of left humerus, subsequent encounter for fracture with routine healing: Secondary | ICD-10-CM | POA: Diagnosis not present

## 2022-10-22 DIAGNOSIS — M5136 Other intervertebral disc degeneration, lumbar region: Secondary | ICD-10-CM | POA: Diagnosis not present

## 2022-10-22 DIAGNOSIS — J45909 Unspecified asthma, uncomplicated: Secondary | ICD-10-CM | POA: Diagnosis not present

## 2022-10-22 DIAGNOSIS — E7800 Pure hypercholesterolemia, unspecified: Secondary | ICD-10-CM | POA: Diagnosis not present

## 2022-10-22 DIAGNOSIS — N390 Urinary tract infection, site not specified: Secondary | ICD-10-CM | POA: Diagnosis not present

## 2022-10-22 DIAGNOSIS — K219 Gastro-esophageal reflux disease without esophagitis: Secondary | ICD-10-CM | POA: Diagnosis not present

## 2022-10-22 DIAGNOSIS — Z604 Social exclusion and rejection: Secondary | ICD-10-CM | POA: Diagnosis not present

## 2022-10-22 DIAGNOSIS — M810 Age-related osteoporosis without current pathological fracture: Secondary | ICD-10-CM | POA: Diagnosis not present

## 2022-10-22 DIAGNOSIS — M109 Gout, unspecified: Secondary | ICD-10-CM | POA: Diagnosis not present

## 2022-10-22 DIAGNOSIS — Z853 Personal history of malignant neoplasm of breast: Secondary | ICD-10-CM | POA: Diagnosis not present

## 2022-10-22 DIAGNOSIS — Z9181 History of falling: Secondary | ICD-10-CM | POA: Diagnosis not present

## 2022-10-22 DIAGNOSIS — Z556 Problems related to health literacy: Secondary | ICD-10-CM | POA: Diagnosis not present

## 2022-10-22 DIAGNOSIS — R32 Unspecified urinary incontinence: Secondary | ICD-10-CM | POA: Diagnosis not present

## 2022-10-22 DIAGNOSIS — K589 Irritable bowel syndrome without diarrhea: Secondary | ICD-10-CM | POA: Diagnosis not present

## 2022-10-22 DIAGNOSIS — I129 Hypertensive chronic kidney disease with stage 1 through stage 4 chronic kidney disease, or unspecified chronic kidney disease: Secondary | ICD-10-CM | POA: Diagnosis not present

## 2022-10-22 DIAGNOSIS — N189 Chronic kidney disease, unspecified: Secondary | ICD-10-CM | POA: Diagnosis not present

## 2022-10-22 DIAGNOSIS — S42302D Unspecified fracture of shaft of humerus, left arm, subsequent encounter for fracture with routine healing: Secondary | ICD-10-CM | POA: Diagnosis not present

## 2022-10-22 DIAGNOSIS — R413 Other amnesia: Secondary | ICD-10-CM | POA: Diagnosis not present

## 2022-10-27 ENCOUNTER — Ambulatory Visit: Payer: Medicare Other | Admitting: Podiatry

## 2022-11-03 DIAGNOSIS — I129 Hypertensive chronic kidney disease with stage 1 through stage 4 chronic kidney disease, or unspecified chronic kidney disease: Secondary | ICD-10-CM | POA: Diagnosis not present

## 2022-11-03 DIAGNOSIS — N189 Chronic kidney disease, unspecified: Secondary | ICD-10-CM | POA: Diagnosis not present

## 2022-11-03 DIAGNOSIS — M109 Gout, unspecified: Secondary | ICD-10-CM | POA: Diagnosis not present

## 2022-11-03 DIAGNOSIS — J45909 Unspecified asthma, uncomplicated: Secondary | ICD-10-CM | POA: Diagnosis not present

## 2022-11-03 DIAGNOSIS — N390 Urinary tract infection, site not specified: Secondary | ICD-10-CM | POA: Diagnosis not present

## 2022-11-03 DIAGNOSIS — S42302D Unspecified fracture of shaft of humerus, left arm, subsequent encounter for fracture with routine healing: Secondary | ICD-10-CM | POA: Diagnosis not present

## 2022-11-05 DIAGNOSIS — S42302D Unspecified fracture of shaft of humerus, left arm, subsequent encounter for fracture with routine healing: Secondary | ICD-10-CM | POA: Diagnosis not present

## 2022-11-05 DIAGNOSIS — N390 Urinary tract infection, site not specified: Secondary | ICD-10-CM | POA: Diagnosis not present

## 2022-11-05 DIAGNOSIS — I129 Hypertensive chronic kidney disease with stage 1 through stage 4 chronic kidney disease, or unspecified chronic kidney disease: Secondary | ICD-10-CM | POA: Diagnosis not present

## 2022-11-05 DIAGNOSIS — N189 Chronic kidney disease, unspecified: Secondary | ICD-10-CM | POA: Diagnosis not present

## 2022-11-05 DIAGNOSIS — M109 Gout, unspecified: Secondary | ICD-10-CM | POA: Diagnosis not present

## 2022-11-05 DIAGNOSIS — J45909 Unspecified asthma, uncomplicated: Secondary | ICD-10-CM | POA: Diagnosis not present

## 2022-11-06 DIAGNOSIS — S42202D Unspecified fracture of upper end of left humerus, subsequent encounter for fracture with routine healing: Secondary | ICD-10-CM | POA: Diagnosis not present

## 2022-11-10 DIAGNOSIS — N189 Chronic kidney disease, unspecified: Secondary | ICD-10-CM | POA: Diagnosis not present

## 2022-11-10 DIAGNOSIS — N390 Urinary tract infection, site not specified: Secondary | ICD-10-CM | POA: Diagnosis not present

## 2022-11-10 DIAGNOSIS — M109 Gout, unspecified: Secondary | ICD-10-CM | POA: Diagnosis not present

## 2022-11-10 DIAGNOSIS — I129 Hypertensive chronic kidney disease with stage 1 through stage 4 chronic kidney disease, or unspecified chronic kidney disease: Secondary | ICD-10-CM | POA: Diagnosis not present

## 2022-11-10 DIAGNOSIS — S42302D Unspecified fracture of shaft of humerus, left arm, subsequent encounter for fracture with routine healing: Secondary | ICD-10-CM | POA: Diagnosis not present

## 2022-11-10 DIAGNOSIS — J45909 Unspecified asthma, uncomplicated: Secondary | ICD-10-CM | POA: Diagnosis not present

## 2022-11-12 DIAGNOSIS — N390 Urinary tract infection, site not specified: Secondary | ICD-10-CM | POA: Diagnosis not present

## 2022-11-12 DIAGNOSIS — N189 Chronic kidney disease, unspecified: Secondary | ICD-10-CM | POA: Diagnosis not present

## 2022-11-12 DIAGNOSIS — I129 Hypertensive chronic kidney disease with stage 1 through stage 4 chronic kidney disease, or unspecified chronic kidney disease: Secondary | ICD-10-CM | POA: Diagnosis not present

## 2022-11-12 DIAGNOSIS — M109 Gout, unspecified: Secondary | ICD-10-CM | POA: Diagnosis not present

## 2022-11-12 DIAGNOSIS — J45909 Unspecified asthma, uncomplicated: Secondary | ICD-10-CM | POA: Diagnosis not present

## 2022-11-12 DIAGNOSIS — S42302D Unspecified fracture of shaft of humerus, left arm, subsequent encounter for fracture with routine healing: Secondary | ICD-10-CM | POA: Diagnosis not present

## 2022-11-17 DIAGNOSIS — J45909 Unspecified asthma, uncomplicated: Secondary | ICD-10-CM | POA: Diagnosis not present

## 2022-11-17 DIAGNOSIS — N189 Chronic kidney disease, unspecified: Secondary | ICD-10-CM | POA: Diagnosis not present

## 2022-11-17 DIAGNOSIS — M109 Gout, unspecified: Secondary | ICD-10-CM | POA: Diagnosis not present

## 2022-11-17 DIAGNOSIS — I129 Hypertensive chronic kidney disease with stage 1 through stage 4 chronic kidney disease, or unspecified chronic kidney disease: Secondary | ICD-10-CM | POA: Diagnosis not present

## 2022-11-17 DIAGNOSIS — S42302D Unspecified fracture of shaft of humerus, left arm, subsequent encounter for fracture with routine healing: Secondary | ICD-10-CM | POA: Diagnosis not present

## 2022-11-17 DIAGNOSIS — N390 Urinary tract infection, site not specified: Secondary | ICD-10-CM | POA: Diagnosis not present

## 2022-11-18 DIAGNOSIS — N390 Urinary tract infection, site not specified: Secondary | ICD-10-CM | POA: Diagnosis not present

## 2022-11-18 DIAGNOSIS — S42302D Unspecified fracture of shaft of humerus, left arm, subsequent encounter for fracture with routine healing: Secondary | ICD-10-CM | POA: Diagnosis not present

## 2022-11-18 DIAGNOSIS — M109 Gout, unspecified: Secondary | ICD-10-CM | POA: Diagnosis not present

## 2022-11-18 DIAGNOSIS — I129 Hypertensive chronic kidney disease with stage 1 through stage 4 chronic kidney disease, or unspecified chronic kidney disease: Secondary | ICD-10-CM | POA: Diagnosis not present

## 2022-11-18 DIAGNOSIS — J45909 Unspecified asthma, uncomplicated: Secondary | ICD-10-CM | POA: Diagnosis not present

## 2022-11-18 DIAGNOSIS — N189 Chronic kidney disease, unspecified: Secondary | ICD-10-CM | POA: Diagnosis not present

## 2022-11-21 DIAGNOSIS — N189 Chronic kidney disease, unspecified: Secondary | ICD-10-CM | POA: Diagnosis not present

## 2022-11-21 DIAGNOSIS — I129 Hypertensive chronic kidney disease with stage 1 through stage 4 chronic kidney disease, or unspecified chronic kidney disease: Secondary | ICD-10-CM | POA: Diagnosis not present

## 2022-11-21 DIAGNOSIS — R413 Other amnesia: Secondary | ICD-10-CM | POA: Diagnosis not present

## 2022-11-21 DIAGNOSIS — E7800 Pure hypercholesterolemia, unspecified: Secondary | ICD-10-CM | POA: Diagnosis not present

## 2022-11-21 DIAGNOSIS — Z604 Social exclusion and rejection: Secondary | ICD-10-CM | POA: Diagnosis not present

## 2022-11-21 DIAGNOSIS — Z9181 History of falling: Secondary | ICD-10-CM | POA: Diagnosis not present

## 2022-11-21 DIAGNOSIS — J45909 Unspecified asthma, uncomplicated: Secondary | ICD-10-CM | POA: Diagnosis not present

## 2022-11-21 DIAGNOSIS — Z853 Personal history of malignant neoplasm of breast: Secondary | ICD-10-CM | POA: Diagnosis not present

## 2022-11-21 DIAGNOSIS — M810 Age-related osteoporosis without current pathological fracture: Secondary | ICD-10-CM | POA: Diagnosis not present

## 2022-11-21 DIAGNOSIS — S42302D Unspecified fracture of shaft of humerus, left arm, subsequent encounter for fracture with routine healing: Secondary | ICD-10-CM | POA: Diagnosis not present

## 2022-11-21 DIAGNOSIS — K219 Gastro-esophageal reflux disease without esophagitis: Secondary | ICD-10-CM | POA: Diagnosis not present

## 2022-11-21 DIAGNOSIS — M109 Gout, unspecified: Secondary | ICD-10-CM | POA: Diagnosis not present

## 2022-11-21 DIAGNOSIS — Z556 Problems related to health literacy: Secondary | ICD-10-CM | POA: Diagnosis not present

## 2022-11-21 DIAGNOSIS — K589 Irritable bowel syndrome without diarrhea: Secondary | ICD-10-CM | POA: Diagnosis not present

## 2022-11-21 DIAGNOSIS — R32 Unspecified urinary incontinence: Secondary | ICD-10-CM | POA: Diagnosis not present

## 2022-11-21 DIAGNOSIS — M5136 Other intervertebral disc degeneration, lumbar region: Secondary | ICD-10-CM | POA: Diagnosis not present

## 2022-11-21 DIAGNOSIS — N390 Urinary tract infection, site not specified: Secondary | ICD-10-CM | POA: Diagnosis not present

## 2022-11-24 DIAGNOSIS — E559 Vitamin D deficiency, unspecified: Secondary | ICD-10-CM | POA: Diagnosis not present

## 2022-11-24 DIAGNOSIS — Z79899 Other long term (current) drug therapy: Secondary | ICD-10-CM | POA: Diagnosis not present

## 2022-11-25 DIAGNOSIS — I129 Hypertensive chronic kidney disease with stage 1 through stage 4 chronic kidney disease, or unspecified chronic kidney disease: Secondary | ICD-10-CM | POA: Diagnosis not present

## 2022-11-25 DIAGNOSIS — N390 Urinary tract infection, site not specified: Secondary | ICD-10-CM | POA: Diagnosis not present

## 2022-11-25 DIAGNOSIS — N189 Chronic kidney disease, unspecified: Secondary | ICD-10-CM | POA: Diagnosis not present

## 2022-11-25 DIAGNOSIS — M109 Gout, unspecified: Secondary | ICD-10-CM | POA: Diagnosis not present

## 2022-11-25 DIAGNOSIS — J45909 Unspecified asthma, uncomplicated: Secondary | ICD-10-CM | POA: Diagnosis not present

## 2022-11-25 DIAGNOSIS — S42302D Unspecified fracture of shaft of humerus, left arm, subsequent encounter for fracture with routine healing: Secondary | ICD-10-CM | POA: Diagnosis not present

## 2022-11-26 DIAGNOSIS — N189 Chronic kidney disease, unspecified: Secondary | ICD-10-CM | POA: Diagnosis not present

## 2022-11-26 DIAGNOSIS — N390 Urinary tract infection, site not specified: Secondary | ICD-10-CM | POA: Diagnosis not present

## 2022-11-26 DIAGNOSIS — S42302D Unspecified fracture of shaft of humerus, left arm, subsequent encounter for fracture with routine healing: Secondary | ICD-10-CM | POA: Diagnosis not present

## 2022-11-26 DIAGNOSIS — I129 Hypertensive chronic kidney disease with stage 1 through stage 4 chronic kidney disease, or unspecified chronic kidney disease: Secondary | ICD-10-CM | POA: Diagnosis not present

## 2022-11-26 DIAGNOSIS — J45909 Unspecified asthma, uncomplicated: Secondary | ICD-10-CM | POA: Diagnosis not present

## 2022-11-26 DIAGNOSIS — M109 Gout, unspecified: Secondary | ICD-10-CM | POA: Diagnosis not present

## 2022-11-27 ENCOUNTER — Ambulatory Visit: Payer: Medicare Other | Admitting: Podiatry

## 2022-11-27 DIAGNOSIS — M25512 Pain in left shoulder: Secondary | ICD-10-CM | POA: Diagnosis not present

## 2022-12-01 DIAGNOSIS — S42302D Unspecified fracture of shaft of humerus, left arm, subsequent encounter for fracture with routine healing: Secondary | ICD-10-CM | POA: Diagnosis not present

## 2022-12-01 DIAGNOSIS — R32 Unspecified urinary incontinence: Secondary | ICD-10-CM | POA: Diagnosis not present

## 2022-12-01 DIAGNOSIS — I129 Hypertensive chronic kidney disease with stage 1 through stage 4 chronic kidney disease, or unspecified chronic kidney disease: Secondary | ICD-10-CM | POA: Diagnosis not present

## 2022-12-01 DIAGNOSIS — N390 Urinary tract infection, site not specified: Secondary | ICD-10-CM | POA: Diagnosis not present

## 2022-12-01 DIAGNOSIS — J45909 Unspecified asthma, uncomplicated: Secondary | ICD-10-CM | POA: Diagnosis not present

## 2022-12-01 DIAGNOSIS — N189 Chronic kidney disease, unspecified: Secondary | ICD-10-CM | POA: Diagnosis not present

## 2022-12-01 DIAGNOSIS — M109 Gout, unspecified: Secondary | ICD-10-CM | POA: Diagnosis not present

## 2022-12-01 DIAGNOSIS — M25572 Pain in left ankle and joints of left foot: Secondary | ICD-10-CM | POA: Diagnosis not present

## 2022-12-01 DIAGNOSIS — Z87448 Personal history of other diseases of urinary system: Secondary | ICD-10-CM | POA: Diagnosis not present

## 2022-12-02 DIAGNOSIS — J45909 Unspecified asthma, uncomplicated: Secondary | ICD-10-CM | POA: Diagnosis not present

## 2022-12-02 DIAGNOSIS — M109 Gout, unspecified: Secondary | ICD-10-CM | POA: Diagnosis not present

## 2022-12-02 DIAGNOSIS — N390 Urinary tract infection, site not specified: Secondary | ICD-10-CM | POA: Diagnosis not present

## 2022-12-02 DIAGNOSIS — I129 Hypertensive chronic kidney disease with stage 1 through stage 4 chronic kidney disease, or unspecified chronic kidney disease: Secondary | ICD-10-CM | POA: Diagnosis not present

## 2022-12-02 DIAGNOSIS — S42302D Unspecified fracture of shaft of humerus, left arm, subsequent encounter for fracture with routine healing: Secondary | ICD-10-CM | POA: Diagnosis not present

## 2022-12-02 DIAGNOSIS — N189 Chronic kidney disease, unspecified: Secondary | ICD-10-CM | POA: Diagnosis not present

## 2022-12-04 DIAGNOSIS — M109 Gout, unspecified: Secondary | ICD-10-CM | POA: Diagnosis not present

## 2022-12-04 DIAGNOSIS — N390 Urinary tract infection, site not specified: Secondary | ICD-10-CM | POA: Diagnosis not present

## 2022-12-04 DIAGNOSIS — S42302D Unspecified fracture of shaft of humerus, left arm, subsequent encounter for fracture with routine healing: Secondary | ICD-10-CM | POA: Diagnosis not present

## 2022-12-04 DIAGNOSIS — N189 Chronic kidney disease, unspecified: Secondary | ICD-10-CM | POA: Diagnosis not present

## 2022-12-04 DIAGNOSIS — J45909 Unspecified asthma, uncomplicated: Secondary | ICD-10-CM | POA: Diagnosis not present

## 2022-12-04 DIAGNOSIS — I129 Hypertensive chronic kidney disease with stage 1 through stage 4 chronic kidney disease, or unspecified chronic kidney disease: Secondary | ICD-10-CM | POA: Diagnosis not present

## 2022-12-08 ENCOUNTER — Other Ambulatory Visit: Payer: Self-pay | Admitting: Family Medicine

## 2022-12-08 DIAGNOSIS — N390 Urinary tract infection, site not specified: Secondary | ICD-10-CM | POA: Diagnosis not present

## 2022-12-08 DIAGNOSIS — R2681 Unsteadiness on feet: Secondary | ICD-10-CM

## 2022-12-08 DIAGNOSIS — I129 Hypertensive chronic kidney disease with stage 1 through stage 4 chronic kidney disease, or unspecified chronic kidney disease: Secondary | ICD-10-CM | POA: Diagnosis not present

## 2022-12-08 DIAGNOSIS — E871 Hypo-osmolality and hyponatremia: Secondary | ICD-10-CM

## 2022-12-08 DIAGNOSIS — R413 Other amnesia: Secondary | ICD-10-CM

## 2022-12-08 DIAGNOSIS — J45909 Unspecified asthma, uncomplicated: Secondary | ICD-10-CM | POA: Diagnosis not present

## 2022-12-08 DIAGNOSIS — S42302D Unspecified fracture of shaft of humerus, left arm, subsequent encounter for fracture with routine healing: Secondary | ICD-10-CM | POA: Diagnosis not present

## 2022-12-08 DIAGNOSIS — M109 Gout, unspecified: Secondary | ICD-10-CM | POA: Diagnosis not present

## 2022-12-08 DIAGNOSIS — F1010 Alcohol abuse, uncomplicated: Secondary | ICD-10-CM

## 2022-12-08 DIAGNOSIS — N189 Chronic kidney disease, unspecified: Secondary | ICD-10-CM | POA: Diagnosis not present

## 2022-12-09 DIAGNOSIS — M109 Gout, unspecified: Secondary | ICD-10-CM | POA: Diagnosis not present

## 2022-12-09 DIAGNOSIS — S42302D Unspecified fracture of shaft of humerus, left arm, subsequent encounter for fracture with routine healing: Secondary | ICD-10-CM | POA: Diagnosis not present

## 2022-12-09 DIAGNOSIS — I129 Hypertensive chronic kidney disease with stage 1 through stage 4 chronic kidney disease, or unspecified chronic kidney disease: Secondary | ICD-10-CM | POA: Diagnosis not present

## 2022-12-09 DIAGNOSIS — N189 Chronic kidney disease, unspecified: Secondary | ICD-10-CM | POA: Diagnosis not present

## 2022-12-09 DIAGNOSIS — N390 Urinary tract infection, site not specified: Secondary | ICD-10-CM | POA: Diagnosis not present

## 2022-12-09 DIAGNOSIS — J45909 Unspecified asthma, uncomplicated: Secondary | ICD-10-CM | POA: Diagnosis not present

## 2022-12-10 ENCOUNTER — Ambulatory Visit: Payer: Medicare Other | Admitting: Podiatry

## 2022-12-10 DIAGNOSIS — N189 Chronic kidney disease, unspecified: Secondary | ICD-10-CM | POA: Diagnosis not present

## 2022-12-10 DIAGNOSIS — I129 Hypertensive chronic kidney disease with stage 1 through stage 4 chronic kidney disease, or unspecified chronic kidney disease: Secondary | ICD-10-CM | POA: Diagnosis not present

## 2022-12-10 DIAGNOSIS — S42302D Unspecified fracture of shaft of humerus, left arm, subsequent encounter for fracture with routine healing: Secondary | ICD-10-CM | POA: Diagnosis not present

## 2022-12-10 DIAGNOSIS — M109 Gout, unspecified: Secondary | ICD-10-CM | POA: Diagnosis not present

## 2022-12-10 DIAGNOSIS — J45909 Unspecified asthma, uncomplicated: Secondary | ICD-10-CM | POA: Diagnosis not present

## 2022-12-10 DIAGNOSIS — N390 Urinary tract infection, site not specified: Secondary | ICD-10-CM | POA: Diagnosis not present

## 2022-12-11 ENCOUNTER — Ambulatory Visit (INDEPENDENT_AMBULATORY_CARE_PROVIDER_SITE_OTHER): Payer: Medicare Other | Admitting: Podiatry

## 2022-12-11 DIAGNOSIS — M79675 Pain in left toe(s): Secondary | ICD-10-CM | POA: Diagnosis not present

## 2022-12-11 DIAGNOSIS — B351 Tinea unguium: Secondary | ICD-10-CM

## 2022-12-11 DIAGNOSIS — M79674 Pain in right toe(s): Secondary | ICD-10-CM

## 2022-12-11 NOTE — Progress Notes (Unsigned)
Subjective: Chief Complaint  Patient presents with   routine foot care     89 y.o. returns the office today for painful, elongated, thickened toenails which she cannot trim herself.  She denies any open sores.  No recent injuries or changes to her feet.  She has no new concerns today that she brings up.  PCP: Jani Gravel, MD   Objective: AAO 3, NAD DP/PT pulses palpable, CRT less than 3 seconds Mild chronic appearing edema to the ankle with varicose veins present.  Nails hypertrophic, dystrophic, elongated, brittle, discolored 10.  Base of the hallux toenails.  There is tenderness overlying the nails 1-5 bilaterally.  There is no surrounding erythema, drainage or pus or signs of infection today.  No pain with calf compression, swelling, warmth, erythema.  Assessment: Patient presents with symptomatic onychomycosis  Plan: -Treatment options including alternatives, risks, complications were discussed -Nails sharply debrided 10 without any complications or bleeding -Monitor ingrown toenails -Daily foot inspection. -Discussed daily foot inspection. If there are any changes, to call the office immediately.    Celesta Gentile, DPM

## 2022-12-14 DIAGNOSIS — N390 Urinary tract infection, site not specified: Secondary | ICD-10-CM | POA: Diagnosis not present

## 2022-12-14 DIAGNOSIS — S42302D Unspecified fracture of shaft of humerus, left arm, subsequent encounter for fracture with routine healing: Secondary | ICD-10-CM | POA: Diagnosis not present

## 2022-12-14 DIAGNOSIS — N189 Chronic kidney disease, unspecified: Secondary | ICD-10-CM | POA: Diagnosis not present

## 2022-12-14 DIAGNOSIS — I129 Hypertensive chronic kidney disease with stage 1 through stage 4 chronic kidney disease, or unspecified chronic kidney disease: Secondary | ICD-10-CM | POA: Diagnosis not present

## 2022-12-14 DIAGNOSIS — J45909 Unspecified asthma, uncomplicated: Secondary | ICD-10-CM | POA: Diagnosis not present

## 2022-12-14 DIAGNOSIS — M109 Gout, unspecified: Secondary | ICD-10-CM | POA: Diagnosis not present

## 2022-12-15 DIAGNOSIS — I129 Hypertensive chronic kidney disease with stage 1 through stage 4 chronic kidney disease, or unspecified chronic kidney disease: Secondary | ICD-10-CM | POA: Diagnosis not present

## 2022-12-15 DIAGNOSIS — N189 Chronic kidney disease, unspecified: Secondary | ICD-10-CM | POA: Diagnosis not present

## 2022-12-15 DIAGNOSIS — J45909 Unspecified asthma, uncomplicated: Secondary | ICD-10-CM | POA: Diagnosis not present

## 2022-12-15 DIAGNOSIS — N390 Urinary tract infection, site not specified: Secondary | ICD-10-CM | POA: Diagnosis not present

## 2022-12-15 DIAGNOSIS — S42302D Unspecified fracture of shaft of humerus, left arm, subsequent encounter for fracture with routine healing: Secondary | ICD-10-CM | POA: Diagnosis not present

## 2022-12-15 DIAGNOSIS — M109 Gout, unspecified: Secondary | ICD-10-CM | POA: Diagnosis not present

## 2022-12-16 DIAGNOSIS — N390 Urinary tract infection, site not specified: Secondary | ICD-10-CM | POA: Diagnosis not present

## 2022-12-16 DIAGNOSIS — M109 Gout, unspecified: Secondary | ICD-10-CM | POA: Diagnosis not present

## 2022-12-16 DIAGNOSIS — I129 Hypertensive chronic kidney disease with stage 1 through stage 4 chronic kidney disease, or unspecified chronic kidney disease: Secondary | ICD-10-CM | POA: Diagnosis not present

## 2022-12-16 DIAGNOSIS — N189 Chronic kidney disease, unspecified: Secondary | ICD-10-CM | POA: Diagnosis not present

## 2022-12-16 DIAGNOSIS — J45909 Unspecified asthma, uncomplicated: Secondary | ICD-10-CM | POA: Diagnosis not present

## 2022-12-16 DIAGNOSIS — S42302D Unspecified fracture of shaft of humerus, left arm, subsequent encounter for fracture with routine healing: Secondary | ICD-10-CM | POA: Diagnosis not present

## 2022-12-18 DIAGNOSIS — I129 Hypertensive chronic kidney disease with stage 1 through stage 4 chronic kidney disease, or unspecified chronic kidney disease: Secondary | ICD-10-CM | POA: Diagnosis not present

## 2022-12-18 DIAGNOSIS — N390 Urinary tract infection, site not specified: Secondary | ICD-10-CM | POA: Diagnosis not present

## 2022-12-18 DIAGNOSIS — J45909 Unspecified asthma, uncomplicated: Secondary | ICD-10-CM | POA: Diagnosis not present

## 2022-12-18 DIAGNOSIS — N189 Chronic kidney disease, unspecified: Secondary | ICD-10-CM | POA: Diagnosis not present

## 2022-12-18 DIAGNOSIS — M109 Gout, unspecified: Secondary | ICD-10-CM | POA: Diagnosis not present

## 2022-12-18 DIAGNOSIS — S42302D Unspecified fracture of shaft of humerus, left arm, subsequent encounter for fracture with routine healing: Secondary | ICD-10-CM | POA: Diagnosis not present

## 2022-12-21 DIAGNOSIS — Z87440 Personal history of urinary (tract) infections: Secondary | ICD-10-CM | POA: Diagnosis not present

## 2022-12-21 DIAGNOSIS — Z853 Personal history of malignant neoplasm of breast: Secondary | ICD-10-CM | POA: Diagnosis not present

## 2022-12-21 DIAGNOSIS — K219 Gastro-esophageal reflux disease without esophagitis: Secondary | ICD-10-CM | POA: Diagnosis not present

## 2022-12-21 DIAGNOSIS — I129 Hypertensive chronic kidney disease with stage 1 through stage 4 chronic kidney disease, or unspecified chronic kidney disease: Secondary | ICD-10-CM | POA: Diagnosis not present

## 2022-12-21 DIAGNOSIS — M109 Gout, unspecified: Secondary | ICD-10-CM | POA: Diagnosis not present

## 2022-12-21 DIAGNOSIS — K589 Irritable bowel syndrome without diarrhea: Secondary | ICD-10-CM | POA: Diagnosis not present

## 2022-12-21 DIAGNOSIS — J45909 Unspecified asthma, uncomplicated: Secondary | ICD-10-CM | POA: Diagnosis not present

## 2022-12-21 DIAGNOSIS — N189 Chronic kidney disease, unspecified: Secondary | ICD-10-CM | POA: Diagnosis not present

## 2022-12-21 DIAGNOSIS — S42302D Unspecified fracture of shaft of humerus, left arm, subsequent encounter for fracture with routine healing: Secondary | ICD-10-CM | POA: Diagnosis not present

## 2022-12-21 DIAGNOSIS — Z556 Problems related to health literacy: Secondary | ICD-10-CM | POA: Diagnosis not present

## 2022-12-21 DIAGNOSIS — M5136 Other intervertebral disc degeneration, lumbar region: Secondary | ICD-10-CM | POA: Diagnosis not present

## 2022-12-21 DIAGNOSIS — E7800 Pure hypercholesterolemia, unspecified: Secondary | ICD-10-CM | POA: Diagnosis not present

## 2022-12-21 DIAGNOSIS — Z604 Social exclusion and rejection: Secondary | ICD-10-CM | POA: Diagnosis not present

## 2022-12-21 DIAGNOSIS — R413 Other amnesia: Secondary | ICD-10-CM | POA: Diagnosis not present

## 2022-12-21 DIAGNOSIS — Z9181 History of falling: Secondary | ICD-10-CM | POA: Diagnosis not present

## 2022-12-21 DIAGNOSIS — M810 Age-related osteoporosis without current pathological fracture: Secondary | ICD-10-CM | POA: Diagnosis not present

## 2022-12-21 DIAGNOSIS — R32 Unspecified urinary incontinence: Secondary | ICD-10-CM | POA: Diagnosis not present

## 2022-12-23 DIAGNOSIS — J45909 Unspecified asthma, uncomplicated: Secondary | ICD-10-CM | POA: Diagnosis not present

## 2022-12-23 DIAGNOSIS — I129 Hypertensive chronic kidney disease with stage 1 through stage 4 chronic kidney disease, or unspecified chronic kidney disease: Secondary | ICD-10-CM | POA: Diagnosis not present

## 2022-12-23 DIAGNOSIS — N189 Chronic kidney disease, unspecified: Secondary | ICD-10-CM | POA: Diagnosis not present

## 2022-12-23 DIAGNOSIS — M5136 Other intervertebral disc degeneration, lumbar region: Secondary | ICD-10-CM | POA: Diagnosis not present

## 2022-12-23 DIAGNOSIS — S42302D Unspecified fracture of shaft of humerus, left arm, subsequent encounter for fracture with routine healing: Secondary | ICD-10-CM | POA: Diagnosis not present

## 2022-12-23 DIAGNOSIS — M109 Gout, unspecified: Secondary | ICD-10-CM | POA: Diagnosis not present

## 2022-12-24 DIAGNOSIS — N189 Chronic kidney disease, unspecified: Secondary | ICD-10-CM | POA: Diagnosis not present

## 2022-12-24 DIAGNOSIS — I129 Hypertensive chronic kidney disease with stage 1 through stage 4 chronic kidney disease, or unspecified chronic kidney disease: Secondary | ICD-10-CM | POA: Diagnosis not present

## 2022-12-24 DIAGNOSIS — M5136 Other intervertebral disc degeneration, lumbar region: Secondary | ICD-10-CM | POA: Diagnosis not present

## 2022-12-24 DIAGNOSIS — J45909 Unspecified asthma, uncomplicated: Secondary | ICD-10-CM | POA: Diagnosis not present

## 2022-12-24 DIAGNOSIS — M109 Gout, unspecified: Secondary | ICD-10-CM | POA: Diagnosis not present

## 2022-12-24 DIAGNOSIS — S42302D Unspecified fracture of shaft of humerus, left arm, subsequent encounter for fracture with routine healing: Secondary | ICD-10-CM | POA: Diagnosis not present

## 2022-12-25 ENCOUNTER — Ambulatory Visit: Admission: RE | Admit: 2022-12-25 | Payer: Medicare Other | Source: Ambulatory Visit

## 2022-12-25 DIAGNOSIS — R413 Other amnesia: Secondary | ICD-10-CM

## 2022-12-25 DIAGNOSIS — F1010 Alcohol abuse, uncomplicated: Secondary | ICD-10-CM

## 2022-12-25 DIAGNOSIS — R2681 Unsteadiness on feet: Secondary | ICD-10-CM

## 2022-12-25 DIAGNOSIS — E871 Hypo-osmolality and hyponatremia: Secondary | ICD-10-CM

## 2022-12-25 DIAGNOSIS — R410 Disorientation, unspecified: Secondary | ICD-10-CM | POA: Diagnosis not present

## 2022-12-25 MED ORDER — GADOPICLENOL 0.5 MMOL/ML IV SOLN
6.0000 mL | Freq: Once | INTRAVENOUS | Status: AC | PRN
  Administered 2022-12-25: 6 mL via INTRAVENOUS

## 2022-12-29 DIAGNOSIS — I129 Hypertensive chronic kidney disease with stage 1 through stage 4 chronic kidney disease, or unspecified chronic kidney disease: Secondary | ICD-10-CM | POA: Diagnosis not present

## 2022-12-29 DIAGNOSIS — J45909 Unspecified asthma, uncomplicated: Secondary | ICD-10-CM | POA: Diagnosis not present

## 2022-12-29 DIAGNOSIS — S42302D Unspecified fracture of shaft of humerus, left arm, subsequent encounter for fracture with routine healing: Secondary | ICD-10-CM | POA: Diagnosis not present

## 2022-12-29 DIAGNOSIS — N189 Chronic kidney disease, unspecified: Secondary | ICD-10-CM | POA: Diagnosis not present

## 2022-12-29 DIAGNOSIS — M5136 Other intervertebral disc degeneration, lumbar region: Secondary | ICD-10-CM | POA: Diagnosis not present

## 2022-12-29 DIAGNOSIS — M109 Gout, unspecified: Secondary | ICD-10-CM | POA: Diagnosis not present

## 2022-12-30 DIAGNOSIS — M109 Gout, unspecified: Secondary | ICD-10-CM | POA: Diagnosis not present

## 2022-12-30 DIAGNOSIS — N189 Chronic kidney disease, unspecified: Secondary | ICD-10-CM | POA: Diagnosis not present

## 2022-12-30 DIAGNOSIS — I129 Hypertensive chronic kidney disease with stage 1 through stage 4 chronic kidney disease, or unspecified chronic kidney disease: Secondary | ICD-10-CM | POA: Diagnosis not present

## 2022-12-30 DIAGNOSIS — J45909 Unspecified asthma, uncomplicated: Secondary | ICD-10-CM | POA: Diagnosis not present

## 2022-12-30 DIAGNOSIS — M5136 Other intervertebral disc degeneration, lumbar region: Secondary | ICD-10-CM | POA: Diagnosis not present

## 2022-12-30 DIAGNOSIS — S42302D Unspecified fracture of shaft of humerus, left arm, subsequent encounter for fracture with routine healing: Secondary | ICD-10-CM | POA: Diagnosis not present

## 2022-12-31 DIAGNOSIS — M5136 Other intervertebral disc degeneration, lumbar region: Secondary | ICD-10-CM | POA: Diagnosis not present

## 2022-12-31 DIAGNOSIS — M25512 Pain in left shoulder: Secondary | ICD-10-CM | POA: Diagnosis not present

## 2022-12-31 DIAGNOSIS — M109 Gout, unspecified: Secondary | ICD-10-CM | POA: Diagnosis not present

## 2022-12-31 DIAGNOSIS — S42302D Unspecified fracture of shaft of humerus, left arm, subsequent encounter for fracture with routine healing: Secondary | ICD-10-CM | POA: Diagnosis not present

## 2022-12-31 DIAGNOSIS — I129 Hypertensive chronic kidney disease with stage 1 through stage 4 chronic kidney disease, or unspecified chronic kidney disease: Secondary | ICD-10-CM | POA: Diagnosis not present

## 2022-12-31 DIAGNOSIS — N189 Chronic kidney disease, unspecified: Secondary | ICD-10-CM | POA: Diagnosis not present

## 2022-12-31 DIAGNOSIS — J45909 Unspecified asthma, uncomplicated: Secondary | ICD-10-CM | POA: Diagnosis not present

## 2023-01-04 DIAGNOSIS — M109 Gout, unspecified: Secondary | ICD-10-CM | POA: Diagnosis not present

## 2023-01-04 DIAGNOSIS — I129 Hypertensive chronic kidney disease with stage 1 through stage 4 chronic kidney disease, or unspecified chronic kidney disease: Secondary | ICD-10-CM | POA: Diagnosis not present

## 2023-01-04 DIAGNOSIS — N189 Chronic kidney disease, unspecified: Secondary | ICD-10-CM | POA: Diagnosis not present

## 2023-01-04 DIAGNOSIS — M5136 Other intervertebral disc degeneration, lumbar region: Secondary | ICD-10-CM | POA: Diagnosis not present

## 2023-01-04 DIAGNOSIS — J45909 Unspecified asthma, uncomplicated: Secondary | ICD-10-CM | POA: Diagnosis not present

## 2023-01-04 DIAGNOSIS — S42302D Unspecified fracture of shaft of humerus, left arm, subsequent encounter for fracture with routine healing: Secondary | ICD-10-CM | POA: Diagnosis not present

## 2023-01-06 DIAGNOSIS — J45909 Unspecified asthma, uncomplicated: Secondary | ICD-10-CM | POA: Diagnosis not present

## 2023-01-06 DIAGNOSIS — M5136 Other intervertebral disc degeneration, lumbar region: Secondary | ICD-10-CM | POA: Diagnosis not present

## 2023-01-06 DIAGNOSIS — S42302D Unspecified fracture of shaft of humerus, left arm, subsequent encounter for fracture with routine healing: Secondary | ICD-10-CM | POA: Diagnosis not present

## 2023-01-06 DIAGNOSIS — I129 Hypertensive chronic kidney disease with stage 1 through stage 4 chronic kidney disease, or unspecified chronic kidney disease: Secondary | ICD-10-CM | POA: Diagnosis not present

## 2023-01-06 DIAGNOSIS — M109 Gout, unspecified: Secondary | ICD-10-CM | POA: Diagnosis not present

## 2023-01-06 DIAGNOSIS — N189 Chronic kidney disease, unspecified: Secondary | ICD-10-CM | POA: Diagnosis not present

## 2023-01-08 DIAGNOSIS — M5136 Other intervertebral disc degeneration, lumbar region: Secondary | ICD-10-CM | POA: Diagnosis not present

## 2023-01-08 DIAGNOSIS — S42302D Unspecified fracture of shaft of humerus, left arm, subsequent encounter for fracture with routine healing: Secondary | ICD-10-CM | POA: Diagnosis not present

## 2023-01-08 DIAGNOSIS — J45909 Unspecified asthma, uncomplicated: Secondary | ICD-10-CM | POA: Diagnosis not present

## 2023-01-08 DIAGNOSIS — N189 Chronic kidney disease, unspecified: Secondary | ICD-10-CM | POA: Diagnosis not present

## 2023-01-08 DIAGNOSIS — I129 Hypertensive chronic kidney disease with stage 1 through stage 4 chronic kidney disease, or unspecified chronic kidney disease: Secondary | ICD-10-CM | POA: Diagnosis not present

## 2023-01-08 DIAGNOSIS — M109 Gout, unspecified: Secondary | ICD-10-CM | POA: Diagnosis not present

## 2023-01-11 DIAGNOSIS — J45909 Unspecified asthma, uncomplicated: Secondary | ICD-10-CM | POA: Diagnosis not present

## 2023-01-11 DIAGNOSIS — I129 Hypertensive chronic kidney disease with stage 1 through stage 4 chronic kidney disease, or unspecified chronic kidney disease: Secondary | ICD-10-CM | POA: Diagnosis not present

## 2023-01-11 DIAGNOSIS — S42302D Unspecified fracture of shaft of humerus, left arm, subsequent encounter for fracture with routine healing: Secondary | ICD-10-CM | POA: Diagnosis not present

## 2023-01-11 DIAGNOSIS — M109 Gout, unspecified: Secondary | ICD-10-CM | POA: Diagnosis not present

## 2023-01-11 DIAGNOSIS — M5136 Other intervertebral disc degeneration, lumbar region: Secondary | ICD-10-CM | POA: Diagnosis not present

## 2023-01-11 DIAGNOSIS — N189 Chronic kidney disease, unspecified: Secondary | ICD-10-CM | POA: Diagnosis not present

## 2023-01-12 DIAGNOSIS — S42302D Unspecified fracture of shaft of humerus, left arm, subsequent encounter for fracture with routine healing: Secondary | ICD-10-CM | POA: Diagnosis not present

## 2023-01-12 DIAGNOSIS — I129 Hypertensive chronic kidney disease with stage 1 through stage 4 chronic kidney disease, or unspecified chronic kidney disease: Secondary | ICD-10-CM | POA: Diagnosis not present

## 2023-01-12 DIAGNOSIS — M109 Gout, unspecified: Secondary | ICD-10-CM | POA: Diagnosis not present

## 2023-01-12 DIAGNOSIS — J45909 Unspecified asthma, uncomplicated: Secondary | ICD-10-CM | POA: Diagnosis not present

## 2023-01-12 DIAGNOSIS — M5136 Other intervertebral disc degeneration, lumbar region: Secondary | ICD-10-CM | POA: Diagnosis not present

## 2023-01-12 DIAGNOSIS — N189 Chronic kidney disease, unspecified: Secondary | ICD-10-CM | POA: Diagnosis not present

## 2023-01-15 DIAGNOSIS — M109 Gout, unspecified: Secondary | ICD-10-CM | POA: Diagnosis not present

## 2023-01-15 DIAGNOSIS — M5136 Other intervertebral disc degeneration, lumbar region: Secondary | ICD-10-CM | POA: Diagnosis not present

## 2023-01-15 DIAGNOSIS — J45909 Unspecified asthma, uncomplicated: Secondary | ICD-10-CM | POA: Diagnosis not present

## 2023-01-15 DIAGNOSIS — I129 Hypertensive chronic kidney disease with stage 1 through stage 4 chronic kidney disease, or unspecified chronic kidney disease: Secondary | ICD-10-CM | POA: Diagnosis not present

## 2023-01-15 DIAGNOSIS — S42302D Unspecified fracture of shaft of humerus, left arm, subsequent encounter for fracture with routine healing: Secondary | ICD-10-CM | POA: Diagnosis not present

## 2023-01-15 DIAGNOSIS — N189 Chronic kidney disease, unspecified: Secondary | ICD-10-CM | POA: Diagnosis not present

## 2023-01-20 DIAGNOSIS — S42302D Unspecified fracture of shaft of humerus, left arm, subsequent encounter for fracture with routine healing: Secondary | ICD-10-CM | POA: Diagnosis not present

## 2023-01-20 DIAGNOSIS — J45909 Unspecified asthma, uncomplicated: Secondary | ICD-10-CM | POA: Diagnosis not present

## 2023-01-20 DIAGNOSIS — I129 Hypertensive chronic kidney disease with stage 1 through stage 4 chronic kidney disease, or unspecified chronic kidney disease: Secondary | ICD-10-CM | POA: Diagnosis not present

## 2023-01-20 DIAGNOSIS — R413 Other amnesia: Secondary | ICD-10-CM | POA: Diagnosis not present

## 2023-01-20 DIAGNOSIS — Z853 Personal history of malignant neoplasm of breast: Secondary | ICD-10-CM | POA: Diagnosis not present

## 2023-01-20 DIAGNOSIS — M5136 Other intervertebral disc degeneration, lumbar region: Secondary | ICD-10-CM | POA: Diagnosis not present

## 2023-01-20 DIAGNOSIS — Z9181 History of falling: Secondary | ICD-10-CM | POA: Diagnosis not present

## 2023-01-20 DIAGNOSIS — Z604 Social exclusion and rejection: Secondary | ICD-10-CM | POA: Diagnosis not present

## 2023-01-20 DIAGNOSIS — M109 Gout, unspecified: Secondary | ICD-10-CM | POA: Diagnosis not present

## 2023-01-20 DIAGNOSIS — E7800 Pure hypercholesterolemia, unspecified: Secondary | ICD-10-CM | POA: Diagnosis not present

## 2023-01-20 DIAGNOSIS — R32 Unspecified urinary incontinence: Secondary | ICD-10-CM | POA: Diagnosis not present

## 2023-01-20 DIAGNOSIS — N189 Chronic kidney disease, unspecified: Secondary | ICD-10-CM | POA: Diagnosis not present

## 2023-01-20 DIAGNOSIS — K589 Irritable bowel syndrome without diarrhea: Secondary | ICD-10-CM | POA: Diagnosis not present

## 2023-01-20 DIAGNOSIS — M810 Age-related osteoporosis without current pathological fracture: Secondary | ICD-10-CM | POA: Diagnosis not present

## 2023-01-20 DIAGNOSIS — Z87440 Personal history of urinary (tract) infections: Secondary | ICD-10-CM | POA: Diagnosis not present

## 2023-01-20 DIAGNOSIS — Z556 Problems related to health literacy: Secondary | ICD-10-CM | POA: Diagnosis not present

## 2023-01-20 DIAGNOSIS — K219 Gastro-esophageal reflux disease without esophagitis: Secondary | ICD-10-CM | POA: Diagnosis not present

## 2023-01-26 DIAGNOSIS — N189 Chronic kidney disease, unspecified: Secondary | ICD-10-CM | POA: Diagnosis not present

## 2023-01-26 DIAGNOSIS — J45909 Unspecified asthma, uncomplicated: Secondary | ICD-10-CM | POA: Diagnosis not present

## 2023-01-26 DIAGNOSIS — S42302D Unspecified fracture of shaft of humerus, left arm, subsequent encounter for fracture with routine healing: Secondary | ICD-10-CM | POA: Diagnosis not present

## 2023-01-26 DIAGNOSIS — M5136 Other intervertebral disc degeneration, lumbar region: Secondary | ICD-10-CM | POA: Diagnosis not present

## 2023-01-26 DIAGNOSIS — M109 Gout, unspecified: Secondary | ICD-10-CM | POA: Diagnosis not present

## 2023-01-26 DIAGNOSIS — I129 Hypertensive chronic kidney disease with stage 1 through stage 4 chronic kidney disease, or unspecified chronic kidney disease: Secondary | ICD-10-CM | POA: Diagnosis not present

## 2023-02-02 DIAGNOSIS — M5136 Other intervertebral disc degeneration, lumbar region: Secondary | ICD-10-CM | POA: Diagnosis not present

## 2023-02-02 DIAGNOSIS — S42302D Unspecified fracture of shaft of humerus, left arm, subsequent encounter for fracture with routine healing: Secondary | ICD-10-CM | POA: Diagnosis not present

## 2023-02-02 DIAGNOSIS — I129 Hypertensive chronic kidney disease with stage 1 through stage 4 chronic kidney disease, or unspecified chronic kidney disease: Secondary | ICD-10-CM | POA: Diagnosis not present

## 2023-02-02 DIAGNOSIS — M109 Gout, unspecified: Secondary | ICD-10-CM | POA: Diagnosis not present

## 2023-02-02 DIAGNOSIS — Z1231 Encounter for screening mammogram for malignant neoplasm of breast: Secondary | ICD-10-CM | POA: Diagnosis not present

## 2023-02-02 DIAGNOSIS — J45909 Unspecified asthma, uncomplicated: Secondary | ICD-10-CM | POA: Diagnosis not present

## 2023-02-02 DIAGNOSIS — N189 Chronic kidney disease, unspecified: Secondary | ICD-10-CM | POA: Diagnosis not present

## 2023-02-05 DIAGNOSIS — H40022 Open angle with borderline findings, high risk, left eye: Secondary | ICD-10-CM | POA: Diagnosis not present

## 2023-02-05 DIAGNOSIS — H401411 Capsular glaucoma with pseudoexfoliation of lens, right eye, mild stage: Secondary | ICD-10-CM | POA: Diagnosis not present

## 2023-02-09 DIAGNOSIS — N189 Chronic kidney disease, unspecified: Secondary | ICD-10-CM | POA: Diagnosis not present

## 2023-02-09 DIAGNOSIS — M5136 Other intervertebral disc degeneration, lumbar region: Secondary | ICD-10-CM | POA: Diagnosis not present

## 2023-02-09 DIAGNOSIS — S42302D Unspecified fracture of shaft of humerus, left arm, subsequent encounter for fracture with routine healing: Secondary | ICD-10-CM | POA: Diagnosis not present

## 2023-02-09 DIAGNOSIS — J45909 Unspecified asthma, uncomplicated: Secondary | ICD-10-CM | POA: Diagnosis not present

## 2023-02-09 DIAGNOSIS — M109 Gout, unspecified: Secondary | ICD-10-CM | POA: Diagnosis not present

## 2023-02-09 DIAGNOSIS — I129 Hypertensive chronic kidney disease with stage 1 through stage 4 chronic kidney disease, or unspecified chronic kidney disease: Secondary | ICD-10-CM | POA: Diagnosis not present

## 2023-02-12 DIAGNOSIS — M25512 Pain in left shoulder: Secondary | ICD-10-CM | POA: Diagnosis not present

## 2023-02-16 DIAGNOSIS — N189 Chronic kidney disease, unspecified: Secondary | ICD-10-CM | POA: Diagnosis not present

## 2023-02-16 DIAGNOSIS — S42302D Unspecified fracture of shaft of humerus, left arm, subsequent encounter for fracture with routine healing: Secondary | ICD-10-CM | POA: Diagnosis not present

## 2023-02-16 DIAGNOSIS — I129 Hypertensive chronic kidney disease with stage 1 through stage 4 chronic kidney disease, or unspecified chronic kidney disease: Secondary | ICD-10-CM | POA: Diagnosis not present

## 2023-02-16 DIAGNOSIS — M109 Gout, unspecified: Secondary | ICD-10-CM | POA: Diagnosis not present

## 2023-02-16 DIAGNOSIS — M5136 Other intervertebral disc degeneration, lumbar region: Secondary | ICD-10-CM | POA: Diagnosis not present

## 2023-02-16 DIAGNOSIS — J45909 Unspecified asthma, uncomplicated: Secondary | ICD-10-CM | POA: Diagnosis not present

## 2023-02-19 DIAGNOSIS — M5136 Other intervertebral disc degeneration, lumbar region: Secondary | ICD-10-CM | POA: Diagnosis not present

## 2023-02-19 DIAGNOSIS — Z9181 History of falling: Secondary | ICD-10-CM | POA: Diagnosis not present

## 2023-02-19 DIAGNOSIS — M109 Gout, unspecified: Secondary | ICD-10-CM | POA: Diagnosis not present

## 2023-02-19 DIAGNOSIS — J45909 Unspecified asthma, uncomplicated: Secondary | ICD-10-CM | POA: Diagnosis not present

## 2023-02-19 DIAGNOSIS — E7800 Pure hypercholesterolemia, unspecified: Secondary | ICD-10-CM | POA: Diagnosis not present

## 2023-02-19 DIAGNOSIS — M80022D Age-related osteoporosis with current pathological fracture, left humerus, subsequent encounter for fracture with routine healing: Secondary | ICD-10-CM | POA: Diagnosis not present

## 2023-02-19 DIAGNOSIS — R32 Unspecified urinary incontinence: Secondary | ICD-10-CM | POA: Diagnosis not present

## 2023-02-19 DIAGNOSIS — K219 Gastro-esophageal reflux disease without esophagitis: Secondary | ICD-10-CM | POA: Diagnosis not present

## 2023-02-19 DIAGNOSIS — Z87440 Personal history of urinary (tract) infections: Secondary | ICD-10-CM | POA: Diagnosis not present

## 2023-02-19 DIAGNOSIS — Z79891 Long term (current) use of opiate analgesic: Secondary | ICD-10-CM | POA: Diagnosis not present

## 2023-02-19 DIAGNOSIS — N189 Chronic kidney disease, unspecified: Secondary | ICD-10-CM | POA: Diagnosis not present

## 2023-02-19 DIAGNOSIS — Z556 Problems related to health literacy: Secondary | ICD-10-CM | POA: Diagnosis not present

## 2023-02-19 DIAGNOSIS — K589 Irritable bowel syndrome without diarrhea: Secondary | ICD-10-CM | POA: Diagnosis not present

## 2023-02-19 DIAGNOSIS — R413 Other amnesia: Secondary | ICD-10-CM | POA: Diagnosis not present

## 2023-02-19 DIAGNOSIS — Z604 Social exclusion and rejection: Secondary | ICD-10-CM | POA: Diagnosis not present

## 2023-02-19 DIAGNOSIS — Z853 Personal history of malignant neoplasm of breast: Secondary | ICD-10-CM | POA: Diagnosis not present

## 2023-02-19 DIAGNOSIS — I129 Hypertensive chronic kidney disease with stage 1 through stage 4 chronic kidney disease, or unspecified chronic kidney disease: Secondary | ICD-10-CM | POA: Diagnosis not present

## 2023-02-24 DIAGNOSIS — M80022D Age-related osteoporosis with current pathological fracture, left humerus, subsequent encounter for fracture with routine healing: Secondary | ICD-10-CM | POA: Diagnosis not present

## 2023-02-24 DIAGNOSIS — M109 Gout, unspecified: Secondary | ICD-10-CM | POA: Diagnosis not present

## 2023-02-24 DIAGNOSIS — J45909 Unspecified asthma, uncomplicated: Secondary | ICD-10-CM | POA: Diagnosis not present

## 2023-02-24 DIAGNOSIS — N189 Chronic kidney disease, unspecified: Secondary | ICD-10-CM | POA: Diagnosis not present

## 2023-02-24 DIAGNOSIS — I129 Hypertensive chronic kidney disease with stage 1 through stage 4 chronic kidney disease, or unspecified chronic kidney disease: Secondary | ICD-10-CM | POA: Diagnosis not present

## 2023-02-24 DIAGNOSIS — M5136 Other intervertebral disc degeneration, lumbar region: Secondary | ICD-10-CM | POA: Diagnosis not present

## 2023-02-26 DIAGNOSIS — M109 Gout, unspecified: Secondary | ICD-10-CM | POA: Diagnosis not present

## 2023-02-26 DIAGNOSIS — I129 Hypertensive chronic kidney disease with stage 1 through stage 4 chronic kidney disease, or unspecified chronic kidney disease: Secondary | ICD-10-CM | POA: Diagnosis not present

## 2023-02-26 DIAGNOSIS — M80022D Age-related osteoporosis with current pathological fracture, left humerus, subsequent encounter for fracture with routine healing: Secondary | ICD-10-CM | POA: Diagnosis not present

## 2023-02-26 DIAGNOSIS — J45909 Unspecified asthma, uncomplicated: Secondary | ICD-10-CM | POA: Diagnosis not present

## 2023-02-26 DIAGNOSIS — M5136 Other intervertebral disc degeneration, lumbar region: Secondary | ICD-10-CM | POA: Diagnosis not present

## 2023-02-26 DIAGNOSIS — N189 Chronic kidney disease, unspecified: Secondary | ICD-10-CM | POA: Diagnosis not present

## 2023-03-01 DIAGNOSIS — M109 Gout, unspecified: Secondary | ICD-10-CM | POA: Diagnosis not present

## 2023-03-01 DIAGNOSIS — M5136 Other intervertebral disc degeneration, lumbar region: Secondary | ICD-10-CM | POA: Diagnosis not present

## 2023-03-01 DIAGNOSIS — M80022D Age-related osteoporosis with current pathological fracture, left humerus, subsequent encounter for fracture with routine healing: Secondary | ICD-10-CM | POA: Diagnosis not present

## 2023-03-01 DIAGNOSIS — N189 Chronic kidney disease, unspecified: Secondary | ICD-10-CM | POA: Diagnosis not present

## 2023-03-01 DIAGNOSIS — J45909 Unspecified asthma, uncomplicated: Secondary | ICD-10-CM | POA: Diagnosis not present

## 2023-03-01 DIAGNOSIS — I129 Hypertensive chronic kidney disease with stage 1 through stage 4 chronic kidney disease, or unspecified chronic kidney disease: Secondary | ICD-10-CM | POA: Diagnosis not present

## 2023-03-02 DIAGNOSIS — E871 Hypo-osmolality and hyponatremia: Secondary | ICD-10-CM | POA: Diagnosis not present

## 2023-03-02 DIAGNOSIS — Z79899 Other long term (current) drug therapy: Secondary | ICD-10-CM | POA: Diagnosis not present

## 2023-03-02 DIAGNOSIS — R413 Other amnesia: Secondary | ICD-10-CM | POA: Diagnosis not present

## 2023-03-02 DIAGNOSIS — Z022 Encounter for examination for admission to residential institution: Secondary | ICD-10-CM | POA: Diagnosis not present

## 2023-03-02 DIAGNOSIS — M810 Age-related osteoporosis without current pathological fracture: Secondary | ICD-10-CM | POA: Diagnosis not present

## 2023-03-02 DIAGNOSIS — H409 Unspecified glaucoma: Secondary | ICD-10-CM | POA: Diagnosis not present

## 2023-03-02 DIAGNOSIS — S41111A Laceration without foreign body of right upper arm, initial encounter: Secondary | ICD-10-CM | POA: Diagnosis not present

## 2023-03-02 DIAGNOSIS — M109 Gout, unspecified: Secondary | ICD-10-CM | POA: Diagnosis not present

## 2023-03-02 DIAGNOSIS — I129 Hypertensive chronic kidney disease with stage 1 through stage 4 chronic kidney disease, or unspecified chronic kidney disease: Secondary | ICD-10-CM | POA: Diagnosis not present

## 2023-03-02 DIAGNOSIS — Z111 Encounter for screening for respiratory tuberculosis: Secondary | ICD-10-CM | POA: Diagnosis not present

## 2023-03-02 DIAGNOSIS — E039 Hypothyroidism, unspecified: Secondary | ICD-10-CM | POA: Diagnosis not present

## 2023-03-02 DIAGNOSIS — F419 Anxiety disorder, unspecified: Secondary | ICD-10-CM | POA: Diagnosis not present

## 2023-03-02 DIAGNOSIS — E559 Vitamin D deficiency, unspecified: Secondary | ICD-10-CM | POA: Diagnosis not present

## 2023-03-04 DIAGNOSIS — J45909 Unspecified asthma, uncomplicated: Secondary | ICD-10-CM | POA: Diagnosis not present

## 2023-03-04 DIAGNOSIS — M109 Gout, unspecified: Secondary | ICD-10-CM | POA: Diagnosis not present

## 2023-03-04 DIAGNOSIS — N189 Chronic kidney disease, unspecified: Secondary | ICD-10-CM | POA: Diagnosis not present

## 2023-03-04 DIAGNOSIS — I129 Hypertensive chronic kidney disease with stage 1 through stage 4 chronic kidney disease, or unspecified chronic kidney disease: Secondary | ICD-10-CM | POA: Diagnosis not present

## 2023-03-04 DIAGNOSIS — M5136 Other intervertebral disc degeneration, lumbar region: Secondary | ICD-10-CM | POA: Diagnosis not present

## 2023-03-04 DIAGNOSIS — M80022D Age-related osteoporosis with current pathological fracture, left humerus, subsequent encounter for fracture with routine healing: Secondary | ICD-10-CM | POA: Diagnosis not present

## 2023-03-05 ENCOUNTER — Encounter: Payer: Self-pay | Admitting: Podiatrist

## 2023-03-05 ENCOUNTER — Ambulatory Visit: Payer: Medicare Other | Admitting: Podiatry

## 2023-03-05 ENCOUNTER — Ambulatory Visit (INDEPENDENT_AMBULATORY_CARE_PROVIDER_SITE_OTHER): Payer: Medicare Other | Admitting: Podiatrist

## 2023-03-05 DIAGNOSIS — M79675 Pain in left toe(s): Secondary | ICD-10-CM

## 2023-03-05 DIAGNOSIS — B351 Tinea unguium: Secondary | ICD-10-CM

## 2023-03-05 DIAGNOSIS — M79674 Pain in right toe(s): Secondary | ICD-10-CM | POA: Diagnosis not present

## 2023-03-05 NOTE — Progress Notes (Signed)
Subjective: Chief Complaint  Patient presents with   Debridement    Trim toenails     89 y.o. returns the office today for painful, elongated, thickened toenails which she cannot trim herself.  She denies any open sores.  No recent injuries or changes to her feet.  She has no new concerns today.  Since I saw her last she had a fall injuring her left shoulder she is being treated for this.  PCP: Pearson Grippe, MD   Objective: AAO 3, NAD DP/PT pulses palpable, CRT less than 3 seconds Mild chronic appearing edema to the ankle with varicose veins present.  Nails hypertrophic, dystrophic, elongated, brittle, discolored 10.   There is tenderness overlying the nails 1-5 bilaterally.  There is no surrounding erythema, drainage or pus or signs of infection today.  No pain with calf compression, swelling, warmth, erythema.  Assessment: Patient presents with symptomatic onychomycosis  Plan: -Treatment options including alternatives, risks, complications were discussed -Nails sharply debrided 10 without any complications or bleeding -Daily foot inspection. -Discussed daily foot inspection. If there are any changes, to call the office immediately.

## 2023-03-08 DIAGNOSIS — J45909 Unspecified asthma, uncomplicated: Secondary | ICD-10-CM | POA: Diagnosis not present

## 2023-03-08 DIAGNOSIS — M80022D Age-related osteoporosis with current pathological fracture, left humerus, subsequent encounter for fracture with routine healing: Secondary | ICD-10-CM | POA: Diagnosis not present

## 2023-03-08 DIAGNOSIS — M5136 Other intervertebral disc degeneration, lumbar region: Secondary | ICD-10-CM | POA: Diagnosis not present

## 2023-03-08 DIAGNOSIS — N189 Chronic kidney disease, unspecified: Secondary | ICD-10-CM | POA: Diagnosis not present

## 2023-03-08 DIAGNOSIS — I129 Hypertensive chronic kidney disease with stage 1 through stage 4 chronic kidney disease, or unspecified chronic kidney disease: Secondary | ICD-10-CM | POA: Diagnosis not present

## 2023-03-08 DIAGNOSIS — M109 Gout, unspecified: Secondary | ICD-10-CM | POA: Diagnosis not present

## 2023-04-08 ENCOUNTER — Ambulatory Visit (INDEPENDENT_AMBULATORY_CARE_PROVIDER_SITE_OTHER): Payer: Medicare Other | Admitting: Neurology

## 2023-04-08 ENCOUNTER — Encounter: Payer: Self-pay | Admitting: Neurology

## 2023-04-08 VITALS — BP 131/88 | HR 82 | Resp 16 | Ht 61.5 in

## 2023-04-08 DIAGNOSIS — F02B4 Dementia in other diseases classified elsewhere, moderate, with anxiety: Secondary | ICD-10-CM

## 2023-04-08 DIAGNOSIS — G301 Alzheimer's disease with late onset: Secondary | ICD-10-CM

## 2023-04-08 MED ORDER — CITALOPRAM HYDROBROMIDE 20 MG PO TABS: 20.0000 mg | ORAL_TABLET | Freq: Every day | ORAL | 6 refills | Status: DC

## 2023-04-08 MED ORDER — DONEPEZIL HCL 5 MG PO TABS: 5.0000 mg | ORAL_TABLET | Freq: Every day | ORAL | 6 refills | Status: AC

## 2023-04-08 NOTE — Patient Instructions (Addendum)
Start Aricept 5 mg nightly, if able to tolerate will increase to 10 mg nightly Start Celexa 20 mg daily Consider melatonin 5 mg at bedtime to help with sleep Continue other medications Continue follow-up PCP Return as needed.   There are well-accepted and sensible ways to reduce risk for Alzheimers disease and other degenerative brain disorders .  Exercise Daily Walk A daily 20 minute walk should be part of your routine. Disease related apathy can be a significant roadblock to exercise and the only way to overcome this is to make it a daily routine and perhaps have a reward at the end (something your loved one loves to eat or drink perhaps) or a personal trainer coming to the home can also be very useful. Most importantly, the patient is much more likely to exercise if the caregiver / spouse does it with him/her. In general a structured, repetitive schedule is best.  General Health: Any diseases which effect your body will effect your brain such as a pneumonia, urinary infection, blood clot, heart attack or stroke. Keep contact with your primary care doctor for regular follow ups.  Sleep. A good nights sleep is healthy for the brain. Seven hours is recommended. If you have insomnia or poor sleep habits we can give you some instructions. If you have sleep apnea wear your mask.  Diet: Eating a heart healthy diet is also a good idea; fish and poultry instead of red meat, nuts (mostly non-peanuts), vegetables, fruits, olive oil or canola oil (instead of butter), minimal salt (use other spices to flavor foods), whole grain rice, bread, cereal and pasta and wine in moderation.Research is now showing that the MIND diet, which is a combination of The Mediterranean diet and the DASH diet, is beneficial for cognitive processing and longevity. Information about this diet can be found in The MIND Diet, a book by Alonna Minium, MS, RDN, and online at [employer]  Finances, Power  of [address] and Advance Directives: You should consider putting legal safeguards in place with regard to financial and medical decision making. While the spouse always has power of attorney for medical and financial issues in the absence of any form, you should consider what you want in case the spouse / caregiver is no longer around or capable of making decisions.

## 2023-04-08 NOTE — Progress Notes (Unsigned)
[facility]  PATIENT: Nancy Webb DOB: January 27, 1932  REQUESTING CLINICIAN: Irena Reichmann, DO HISTORY FROM: Patient but mainly daughter and son  REASON FOR VISIT: Memory loss    HISTORICAL  CHIEF COMPLAINT:  Chief Complaint  Patient presents with   New Patient (Initial Visit)    Rm12, daughter and son present, 15 MMSE     HISTORY OF PRESENT ILLNESS:  This is a 89 year old woman with past medical history including hypertension, hyponatremia, GERD, memory loss, chronic alcohol use who is presenting with her daughter and son for management of memory.  In brief they tell me patient used to live independently by herself, driving her car as of March 2024. In April, they noted that she had difficulty using her remote TV, and also it was reported that she was not taking her medications as instructed.   She used to do puzzles but she stopped doing the puzzle in April, she has difficulty following her favorite TV shows.  Family then started noticing the patient has period of confusion, difficulty using items around the house and worsening memory loss. Daughter tells me that it seems like patient has difficulty understanding how to do basic things.  When trying to get in a car, she will standby the door and ask her daughter how do you get in the car.  She can also take her sunglasses, and ask her daughter what should I do with this, how to put it on and she will go ahead and put it on without help.  Family has noted more difficulty and as of September they had taken her to [place] assisted living facility.  She still drinks alcohol, prior to going to [place], she was drinking 3-5 beers or wine cooler per day but since being at [facility], she only drinks 5 ounces of wine every night.    TBI:  No past history of TBI Stroke:  no past history of stroke Seizures:  no past history of seizures Sleep:  no history of sleep apnea.  Mood:  patient denies anxiety and  depression Family history of Dementia:  Denies  Functional status:  Patient lives in an assited living facility . Cooking: warming soup Cleaning: no cleaning  Shopping: not shopping  Bathing: needs help  Toileting: needs help  Driving: Not driving since March Bills: Not paying the bills since March  Medications: Needs helps with her medications  Ever left the stove on by accident?: No Forget how to use items around the house?: Yes  Getting lost going to familiar places?: Yes  Forgetting loved ones names?: No issues  Word finding difficulty? Denies  Sleep: Not sleeping well    OTHER MEDICAL CONDITIONS: Hypertension, GERD, Glaucoma, Chronic alcohol use    REVIEW OF SYSTEMS: Full 14 system review of systems performed and negative with exception of: As noted in the HPI   ALLERGIES: Allergies  Allergen Reactions   Loratadine Other (See Comments)    REACTION: headache REACTION: headache     HOME MEDICATIONS: Outpatient Medications Prior to Visit  Medication Sig Dispense Refill   amLODipine (NORVASC) 5 MG tablet Take 5 mg by mouth daily.      Cholecalciferol 125 MCG (5000 UT) capsule cholecalciferol (vitamin D3) 125 mcg (5,000 unit) capsule     doxycycline (VIBRA-TABS) 100 MG tablet Take 1 tablet (100 mg total) by mouth 2 (two) times daily. 14 tablet 0   Ensure (ENSURE) Take 237 mLs by mouth daily.     esomeprazole (NEXIUM) 40 MG  capsule Take 40 mg by mouth daily at 12 noon.     hyoscyamine (LEVSIN SL) 0.125 MG SL tablet Place 1 tablet (0.125 mg total) under the tongue 4 (four) times daily as needed. 100 tablet 11   irbesartan (AVAPRO) 300 MG tablet Take 300 mg by mouth daily.      montelukast (SINGULAIR) 10 MG tablet Take 1 tablet by mouth daily.     Netarsudil-Latanoprost (ROCKLATAN) 0.02-0.005 % SOLN Apply 1 drop to eye at bedtime.     ondansetron (ZOFRAN) 8 MG tablet Take by mouth every 8 (eight) hours as needed for nausea or vomiting.     timolol (BETIMOL) 0.5 %  ophthalmic solution 1 drop daily.     busPIRone (BUSPAR) 5 MG tablet Take 5 mg by mouth daily.     albuterol (PROAIR HFA) 108 (90 Base) MCG/ACT inhaler ProAir HFA 90 mcg/actuation aerosol inhaler     aspirin 81 MG chewable tablet aspirin 81 mg chewable tablet  Chew 1 tablet every day by oral route.     colchicine (COLCRYS) 0.6 MG tablet Colcrys 0.6 mg tablet     hydrochlorothiazide (HYDRODIURIL) 12.5 MG tablet hydrochlorothiazide 12.5 mg tablet     HYDROcodone-acetaminophen (NORCO/VICODIN) 5-325 MG tablet hydrocodone 5 mg-acetaminophen 325 mg tablet     ibuprofen (ADVIL) 800 MG tablet Take 800 mg by mouth every 8 (eight) hours.     latanoprost (XALATAN) 0.005 % ophthalmic solution Place 1 drop into the right eye at bedtime.     methocarbamol (ROBAXIN) 500 MG tablet Take 500 mg by mouth at bedtime as needed.      mupirocin ointment (BACTROBAN) 2 % Apply 1 Application topically 2 (two) times daily. 30 g 2   NEOMYCIN-POLYMYXIN-HYDROCORTISONE (CORTISPORIN) 1 % SOLN OTIC solution neomycin-polymyxin-hydrocort 3.5 mg/mL-10,000 unit/mL-1 % ear solution     omeprazole (PRILOSEC) 40 MG capsule Take 1 capsule (40 mg total) by mouth daily. 30 capsule 11   timolol (TIMOPTIC) 0.5 % ophthalmic solution Place 1 drop into the right eye daily.     Travoprost, BAK Free, (TRAVATAN Z) 0.004 % SOLN ophthalmic solution 1 drop into affected eye in the evening     VESICARE 5 MG tablet Take 2.5 mg by mouth daily.      Vitamin D, Ergocalciferol, (DRISDOL) 1.25 MG (50000 UNIT) CAPS capsule Take by mouth.     No facility-administered medications prior to visit.    PAST MEDICAL HISTORY: Past Medical History:  Diagnosis Date   Adenomatous colon polyp 02/1993   Allergic rhinitis    Arthritis    Asthma    Breast cancer (HCC)    Right breast   Diverticulosis    GERD (gastroesophageal reflux disease)    Gout    Hiatal hernia    Hyperlipidemia    Hypertension    IBS (irritable bowel syndrome)    Internal  hemorrhoids    Varicose veins    superificial thrombophlebitis right leg  09-2013    PAST SURGICAL HISTORY: Past Surgical History:  Procedure Laterality Date   CATARACT EXTRACTION, BILATERAL  08/2012, 09/2012   CYSTECTOMY Right    ovary   ENDOVENOUS ABLATION SAPHENOUS VEIN W/ LASER Right 07/21/2018   endovenous laser ablation right greater saphenous vein by Waverly Ferrari MD    MASTECTOMY Right 1987    FAMILY HISTORY: Family History  Family history unknown: Yes    SOCIAL HISTORY: Social History   Socioeconomic History   Marital status: Widowed    Spouse name: Not on file  Number of children: 2   Years of education: Not on file   Highest education level: Not on file  Occupational History   Occupation: Accounting retired  Tobacco Use   Smoking status: Never   Smokeless tobacco: Never  Vaping Use   Vaping status: Never Used  Substance and Sexual Activity   Alcohol use: Yes    Comment: social   Drug use: No   Sexual activity: Not on file  Other Topics Concern   Not on file  Social History Narrative   Not on file   Social Determinants of Health   Financial Resource Strain: Not on file  Food Insecurity: Not on file  Transportation Needs: Not on file  Physical Activity: Not on file  Stress: Not on file  Social Connections: Not on file  Intimate Partner Violence: Not on file    PHYSICAL EXAM  GENERAL EXAM/CONSTITUTIONAL: Vitals:  Vitals:   04/08/23 1520  BP: 131/88  Pulse: 82  Resp: 16  Height: 5' 1.5" (1.562 m)   Body mass index is 26.95 kg/m. Wt Readings from Last 3 Encounters:  08/11/18 145 lb (65.8 kg)  07/28/18 141 lb 12.1 oz (64.3 kg)  07/21/18 146 lb (66.2 kg)   Patient is in no distress; well developed, nourished and groomed; neck is supple  MUSCULOSKELETAL: Gait, strength, tone, movements noted in Neurologic exam below  NEUROLOGIC: MENTAL STATUS:     04/08/2023    3:22 PM  MMSE - Mini Mental State Exam  Orientation to time 3   Orientation to Place 3  Registration 2  Attention/ Calculation 0  Recall 0  Language- name 2 objects 2  Language- repeat 0  Language- follow 3 step command 3  Language- read & follow direction 1  Write a sentence 1  Copy design 0  Total score 15   CRANIAL NERVE:  2nd, 3rd, 4th, 6th- visual fields full to confrontation, extraocular muscles intact, no nystagmus 5th - facial sensation symmetric 7th - facial strength symmetric 8th - hearing intact 9th - palate elevates symmetrically, uvula midline 11th - shoulder shrug symmetric 12th - tongue protrusion midline  MOTOR:  normal bulk and tone, full strength in the BUE, BLE  SENSORY:  normal and symmetric to light touch  COORDINATION:  finger-nose-finger, fine finger movements normal  GAIT/STATION:  With a rolling walker      DIAGNOSTIC DATA (LABS, IMAGING, TESTING) - I reviewed patient records, labs, notes, testing and imaging myself where available.  No results found for: "WBC", "HGB", "HCT", "MCV", "PLT" No results found for: "NA", "K", "CL", "CO2", "GLUCOSE", "BUN", "CREATININE", "CALCIUM", "PROT", "ALBUMIN", "AST", "ALT", "ALKPHOS", "BILITOT", "GFRNONAA", "GFRAA" No results found for: "CHOL", "HDL", "LDLCALC", "LDLDIRECT", "TRIG", "CHOLHDL" No results found for: "HGBA1C" No results found for: "VITAMINB12" No results found for: "TSH"  MRI Brain 01/02/2023 1. No acute intracranial abnormality. 2. Mild generalized atrophy and white matter disease likely reflects the sequela of chronic microvascular ischemia. 3. Enlargement of the lateral ventricles, particularly within the atria likely reflects volume loss rather than hydrocephalus. 4. No evidence for metastatic disease to the brain.    ASSESSMENT AND PLAN  89 y.o. year old female with history including hypertension, hyponatremia, GERD, memory loss, chronic alcohol use who is presenting with son and daughter with complaint of memory loss.  Based on history,  examination showing a Mini-Mental status score of 15, patient more likely that not has dementia, probable Alzheimer's disease.  Will start her on Aricept 5 mg nightly and if she  is able to tolerate, will increase to 10 mg.  Due to her anxiety, I will also recommend Celexa and trial of melatonin 5 mg at night for her insomnia.  Informed patient and family that this is a progressive disease and over time it will get worse but will try to slow down the progression with medications such as Aricept and Namenda.  They voiced understanding.  Continue to follow with PCP and I will see them in 1 year for follow-up or sooner if worse.     1. Moderate late onset Alzheimer's dementia with anxiety [facility])      Patient Instructions  Start Aricept 5 mg nightly, if able to tolerate will increase to 10 mg nightly Start Celexa 20 mg daily Consider melatonin 5 mg at bedtime to help with sleep Continue other medications Continue follow-up PCP Return as needed.   There are well-accepted and sensible ways to reduce risk for Alzheimers disease and other degenerative brain disorders .  Exercise Daily Walk A daily 20 minute walk should be part of your routine. Disease related apathy can be a significant roadblock to exercise and the only way to overcome this is to make it a daily routine and perhaps have a reward at the end (something your loved one loves to eat or drink perhaps) or a personal trainer coming to the home can also be very useful. Most importantly, the patient is much more likely to exercise if the caregiver / spouse does it with him/her. In general a structured, repetitive schedule is best.  General Health: Any diseases which effect your body will effect your brain such as a pneumonia, urinary infection, blood clot, heart attack or stroke. Keep contact with your primary care doctor for regular follow ups.  Sleep. A good nights sleep is healthy for the brain. Seven hours is recommended. If you have insomnia  or poor sleep habits we can give you some instructions. If you have sleep apnea wear your mask.  Diet: Eating a heart healthy diet is also a good idea; fish and poultry instead of red meat, nuts (mostly non-peanuts), vegetables, fruits, olive oil or canola oil (instead of butter), minimal salt (use other spices to flavor foods), whole grain rice, bread, cereal and pasta and wine in moderation.Research is now showing that the MIND diet, which is a combination of The Mediterranean diet and the DASH diet, is beneficial for cognitive processing and longevity. Information about this diet can be found in The MIND Diet, a book by Alonna Minium, MS, RDN, and online at [employer].es  Finances, Power of [address] and Advance Directives: You should consider putting legal safeguards in place with regard to financial and medical decision making. While the spouse always has power of attorney for medical and financial issues in the absence of any form, you should consider what you want in case the spouse / caregiver is no longer around or capable of making decisions.   No orders of the defined types were placed in this encounter.   Meds ordered this encounter  Medications   citalopram (CELEXA) 20 MG tablet    Sig: Take 1 tablet (20 mg total) by mouth daily.    Dispense:  30 tablet    Refill:  6   donepezil (ARICEPT) 5 MG tablet    Sig: Take 1 tablet (5 mg total) by mouth at bedtime.    Dispense:  30 tablet    Refill:  6    Return in about 1 year (around 04/07/2024).  Windell Norfolk, MD 04/10/2023, 12:00 PM  [facility] [address] [place], [place] [address] [phone]

## 2023-04-12 ENCOUNTER — Telehealth: Payer: Self-pay

## 2023-04-12 NOTE — Telephone Encounter (Signed)
Letter faxed to law office of cheryl davis w/attn to justin plummer

## 2023-04-23 DIAGNOSIS — M109 Gout, unspecified: Secondary | ICD-10-CM | POA: Diagnosis not present

## 2023-04-23 DIAGNOSIS — E559 Vitamin D deficiency, unspecified: Secondary | ICD-10-CM | POA: Diagnosis not present

## 2023-04-23 DIAGNOSIS — H6120 Impacted cerumen, unspecified ear: Secondary | ICD-10-CM | POA: Diagnosis not present

## 2023-04-23 DIAGNOSIS — Z853 Personal history of malignant neoplasm of breast: Secondary | ICD-10-CM | POA: Diagnosis not present

## 2023-04-23 DIAGNOSIS — R2681 Unsteadiness on feet: Secondary | ICD-10-CM | POA: Diagnosis not present

## 2023-04-23 DIAGNOSIS — K580 Irritable bowel syndrome with diarrhea: Secondary | ICD-10-CM | POA: Diagnosis not present

## 2023-04-23 DIAGNOSIS — M810 Age-related osteoporosis without current pathological fracture: Secondary | ICD-10-CM | POA: Diagnosis not present

## 2023-04-23 DIAGNOSIS — I129 Hypertensive chronic kidney disease with stage 1 through stage 4 chronic kidney disease, or unspecified chronic kidney disease: Secondary | ICD-10-CM | POA: Diagnosis not present

## 2023-04-23 DIAGNOSIS — R300 Dysuria: Secondary | ICD-10-CM | POA: Diagnosis not present

## 2023-04-23 DIAGNOSIS — G301 Alzheimer's disease with late onset: Secondary | ICD-10-CM | POA: Diagnosis not present

## 2023-04-23 DIAGNOSIS — F419 Anxiety disorder, unspecified: Secondary | ICD-10-CM | POA: Diagnosis not present

## 2023-04-23 DIAGNOSIS — H409 Unspecified glaucoma: Secondary | ICD-10-CM | POA: Diagnosis not present

## 2023-04-23 DIAGNOSIS — Z0000 Encounter for general adult medical examination without abnormal findings: Secondary | ICD-10-CM | POA: Diagnosis not present

## 2023-06-07 DIAGNOSIS — L03116 Cellulitis of left lower limb: Secondary | ICD-10-CM | POA: Diagnosis not present

## 2023-06-18 ENCOUNTER — Encounter: Payer: Self-pay | Admitting: Podiatry

## 2023-06-18 ENCOUNTER — Ambulatory Visit (INDEPENDENT_AMBULATORY_CARE_PROVIDER_SITE_OTHER): Payer: Medicare Other | Admitting: Podiatry

## 2023-06-18 DIAGNOSIS — M79675 Pain in left toe(s): Secondary | ICD-10-CM | POA: Diagnosis not present

## 2023-06-18 DIAGNOSIS — B351 Tinea unguium: Secondary | ICD-10-CM | POA: Diagnosis not present

## 2023-06-18 DIAGNOSIS — M79674 Pain in right toe(s): Secondary | ICD-10-CM

## 2023-06-20 NOTE — Progress Notes (Signed)
 Subjective: Chief Complaint  Patient presents with   RFC    Rm#12 RFC  Return in about 3 months for Debride toenails patient states has a spot on left leg that gets inflamed and gets red.   89 y.o. returns the office today for painful, elongated, thickened toenails which she cannot trim herself.  She denies any open sores.  No recent injuries or changes to her feet.  She has a spot on her leg that she would to have checked.  She also was treated for cellulitis of the left leg but that has resolved.  She has no new concerns today.   PCP: Luke Agent, MD   Objective: AAO 3, NAD- presents with son  DP/PT pulses palpable, CRT less than 3 seconds Mild chronic appearing edema to the ankle with varicose veins present.  Nails hypertrophic, dystrophic, elongated, brittle, discolored 10.   There is tenderness overlying the nails 1-5 bilaterally.  There is no surrounding erythema, drainage or pus or signs of infection today.   Medial aspect of the left leg, calf there is an area of hyperkeratotic tissue also looks like a scab.  No significant edema, erythema or signs of infection noted today. No pain with calf compression, swelling, warmth, erythema.  Assessment: Patient presents with symptomatic onychomycosis; skin lesion  Plan: -Treatment options including alternatives, risks, complications were discussed -Nails sharply debrided 10 without any complications or bleeding -Recommended small Vaseline to the area of the lesion on the left leg.  If this is not improving recommend follow-up with her dermatologist. Her son was with her today.  No signs of cellulitis today, monitor for any reoccurrence. -Daily foot inspection. -Discussed daily foot inspection. If there are any changes, to call the office immediately.   Nancy Webb Fees DPM

## 2023-07-02 DIAGNOSIS — H26492 Other secondary cataract, left eye: Secondary | ICD-10-CM | POA: Diagnosis not present

## 2023-07-02 DIAGNOSIS — H401411 Capsular glaucoma with pseudoexfoliation of lens, right eye, mild stage: Secondary | ICD-10-CM | POA: Diagnosis not present

## 2023-07-02 DIAGNOSIS — H04123 Dry eye syndrome of bilateral lacrimal glands: Secondary | ICD-10-CM | POA: Diagnosis not present

## 2023-07-02 DIAGNOSIS — H40022 Open angle with borderline findings, high risk, left eye: Secondary | ICD-10-CM | POA: Diagnosis not present

## 2023-07-30 DIAGNOSIS — L570 Actinic keratosis: Secondary | ICD-10-CM | POA: Diagnosis not present

## 2023-07-30 DIAGNOSIS — D485 Neoplasm of uncertain behavior of skin: Secondary | ICD-10-CM | POA: Diagnosis not present

## 2023-07-30 DIAGNOSIS — L821 Other seborrheic keratosis: Secondary | ICD-10-CM | POA: Diagnosis not present

## 2023-07-30 DIAGNOSIS — L820 Inflamed seborrheic keratosis: Secondary | ICD-10-CM | POA: Diagnosis not present

## 2023-07-30 DIAGNOSIS — D3613 Benign neoplasm of peripheral nerves and autonomic nervous system of lower limb, including hip: Secondary | ICD-10-CM | POA: Diagnosis not present

## 2023-07-30 DIAGNOSIS — Z85828 Personal history of other malignant neoplasm of skin: Secondary | ICD-10-CM | POA: Diagnosis not present

## 2023-07-30 DIAGNOSIS — L309 Dermatitis, unspecified: Secondary | ICD-10-CM | POA: Diagnosis not present

## 2023-08-05 DIAGNOSIS — S81801A Unspecified open wound, right lower leg, initial encounter: Secondary | ICD-10-CM | POA: Diagnosis not present

## 2023-08-05 DIAGNOSIS — M810 Age-related osteoporosis without current pathological fracture: Secondary | ICD-10-CM | POA: Diagnosis not present

## 2023-08-05 DIAGNOSIS — I10 Essential (primary) hypertension: Secondary | ICD-10-CM | POA: Diagnosis not present

## 2023-08-05 DIAGNOSIS — K589 Irritable bowel syndrome without diarrhea: Secondary | ICD-10-CM | POA: Diagnosis not present

## 2023-08-10 DIAGNOSIS — R946 Abnormal results of thyroid function studies: Secondary | ICD-10-CM | POA: Diagnosis not present

## 2023-08-10 DIAGNOSIS — Z13220 Encounter for screening for lipoid disorders: Secondary | ICD-10-CM | POA: Diagnosis not present

## 2023-08-10 DIAGNOSIS — Z48817 Encounter for surgical aftercare following surgery on the skin and subcutaneous tissue: Secondary | ICD-10-CM | POA: Diagnosis not present

## 2023-08-10 DIAGNOSIS — K589 Irritable bowel syndrome without diarrhea: Secondary | ICD-10-CM | POA: Diagnosis not present

## 2023-08-10 DIAGNOSIS — I10 Essential (primary) hypertension: Secondary | ICD-10-CM | POA: Diagnosis not present

## 2023-08-10 DIAGNOSIS — Z0189 Encounter for other specified special examinations: Secondary | ICD-10-CM | POA: Diagnosis not present

## 2023-08-10 DIAGNOSIS — L821 Other seborrheic keratosis: Secondary | ICD-10-CM | POA: Diagnosis not present

## 2023-08-10 DIAGNOSIS — J309 Allergic rhinitis, unspecified: Secondary | ICD-10-CM | POA: Diagnosis not present

## 2023-08-10 DIAGNOSIS — Z1389 Encounter for screening for other disorder: Secondary | ICD-10-CM | POA: Diagnosis not present

## 2023-08-10 DIAGNOSIS — Z1329 Encounter for screening for other suspected endocrine disorder: Secondary | ICD-10-CM | POA: Diagnosis not present

## 2023-08-10 DIAGNOSIS — D510 Vitamin B12 deficiency anemia due to intrinsic factor deficiency: Secondary | ICD-10-CM | POA: Diagnosis not present

## 2023-08-10 DIAGNOSIS — M810 Age-related osteoporosis without current pathological fracture: Secondary | ICD-10-CM | POA: Diagnosis not present

## 2023-08-10 DIAGNOSIS — F411 Generalized anxiety disorder: Secondary | ICD-10-CM | POA: Diagnosis not present

## 2023-08-10 DIAGNOSIS — M1A9XX0 Chronic gout, unspecified, without tophus (tophi): Secondary | ICD-10-CM | POA: Diagnosis not present

## 2023-08-10 DIAGNOSIS — Z853 Personal history of malignant neoplasm of breast: Secondary | ICD-10-CM | POA: Diagnosis not present

## 2023-08-10 DIAGNOSIS — K219 Gastro-esophageal reflux disease without esophagitis: Secondary | ICD-10-CM | POA: Diagnosis not present

## 2023-08-10 DIAGNOSIS — Z85828 Personal history of other malignant neoplasm of skin: Secondary | ICD-10-CM | POA: Diagnosis not present

## 2023-08-10 DIAGNOSIS — E559 Vitamin D deficiency, unspecified: Secondary | ICD-10-CM | POA: Diagnosis not present

## 2023-08-13 DIAGNOSIS — K219 Gastro-esophageal reflux disease without esophagitis: Secondary | ICD-10-CM | POA: Diagnosis not present

## 2023-08-13 DIAGNOSIS — L821 Other seborrheic keratosis: Secondary | ICD-10-CM | POA: Diagnosis not present

## 2023-08-13 DIAGNOSIS — Z48817 Encounter for surgical aftercare following surgery on the skin and subcutaneous tissue: Secondary | ICD-10-CM | POA: Diagnosis not present

## 2023-08-13 DIAGNOSIS — F411 Generalized anxiety disorder: Secondary | ICD-10-CM | POA: Diagnosis not present

## 2023-08-13 DIAGNOSIS — I10 Essential (primary) hypertension: Secondary | ICD-10-CM | POA: Diagnosis not present

## 2023-08-13 DIAGNOSIS — M1A9XX0 Chronic gout, unspecified, without tophus (tophi): Secondary | ICD-10-CM | POA: Diagnosis not present

## 2023-08-17 DIAGNOSIS — M1A9XX0 Chronic gout, unspecified, without tophus (tophi): Secondary | ICD-10-CM | POA: Diagnosis not present

## 2023-08-17 DIAGNOSIS — D519 Vitamin B12 deficiency anemia, unspecified: Secondary | ICD-10-CM | POA: Diagnosis not present

## 2023-08-17 DIAGNOSIS — F411 Generalized anxiety disorder: Secondary | ICD-10-CM | POA: Diagnosis not present

## 2023-08-17 DIAGNOSIS — K219 Gastro-esophageal reflux disease without esophagitis: Secondary | ICD-10-CM | POA: Diagnosis not present

## 2023-08-17 DIAGNOSIS — E782 Mixed hyperlipidemia: Secondary | ICD-10-CM | POA: Diagnosis not present

## 2023-08-17 DIAGNOSIS — L821 Other seborrheic keratosis: Secondary | ICD-10-CM | POA: Diagnosis not present

## 2023-08-17 DIAGNOSIS — I10 Essential (primary) hypertension: Secondary | ICD-10-CM | POA: Diagnosis not present

## 2023-08-17 DIAGNOSIS — E038 Other specified hypothyroidism: Secondary | ICD-10-CM | POA: Diagnosis not present

## 2023-08-17 DIAGNOSIS — Z48817 Encounter for surgical aftercare following surgery on the skin and subcutaneous tissue: Secondary | ICD-10-CM | POA: Diagnosis not present

## 2023-08-17 DIAGNOSIS — E119 Type 2 diabetes mellitus without complications: Secondary | ICD-10-CM | POA: Diagnosis not present

## 2023-08-18 DIAGNOSIS — F411 Generalized anxiety disorder: Secondary | ICD-10-CM | POA: Diagnosis not present

## 2023-08-18 DIAGNOSIS — M1A9XX0 Chronic gout, unspecified, without tophus (tophi): Secondary | ICD-10-CM | POA: Diagnosis not present

## 2023-08-18 DIAGNOSIS — L821 Other seborrheic keratosis: Secondary | ICD-10-CM | POA: Diagnosis not present

## 2023-08-18 DIAGNOSIS — K219 Gastro-esophageal reflux disease without esophagitis: Secondary | ICD-10-CM | POA: Diagnosis not present

## 2023-08-18 DIAGNOSIS — I10 Essential (primary) hypertension: Secondary | ICD-10-CM | POA: Diagnosis not present

## 2023-08-18 DIAGNOSIS — Z48817 Encounter for surgical aftercare following surgery on the skin and subcutaneous tissue: Secondary | ICD-10-CM | POA: Diagnosis not present

## 2023-08-24 DIAGNOSIS — K219 Gastro-esophageal reflux disease without esophagitis: Secondary | ICD-10-CM | POA: Diagnosis not present

## 2023-08-24 DIAGNOSIS — L821 Other seborrheic keratosis: Secondary | ICD-10-CM | POA: Diagnosis not present

## 2023-08-24 DIAGNOSIS — I10 Essential (primary) hypertension: Secondary | ICD-10-CM | POA: Diagnosis not present

## 2023-08-24 DIAGNOSIS — Z48817 Encounter for surgical aftercare following surgery on the skin and subcutaneous tissue: Secondary | ICD-10-CM | POA: Diagnosis not present

## 2023-08-24 DIAGNOSIS — F411 Generalized anxiety disorder: Secondary | ICD-10-CM | POA: Diagnosis not present

## 2023-08-24 DIAGNOSIS — M1A9XX0 Chronic gout, unspecified, without tophus (tophi): Secondary | ICD-10-CM | POA: Diagnosis not present

## 2023-09-07 DIAGNOSIS — Z48817 Encounter for surgical aftercare following surgery on the skin and subcutaneous tissue: Secondary | ICD-10-CM | POA: Diagnosis not present

## 2023-09-07 DIAGNOSIS — F411 Generalized anxiety disorder: Secondary | ICD-10-CM | POA: Diagnosis not present

## 2023-09-07 DIAGNOSIS — K219 Gastro-esophageal reflux disease without esophagitis: Secondary | ICD-10-CM | POA: Diagnosis not present

## 2023-09-07 DIAGNOSIS — I10 Essential (primary) hypertension: Secondary | ICD-10-CM | POA: Diagnosis not present

## 2023-09-07 DIAGNOSIS — M1A9XX0 Chronic gout, unspecified, without tophus (tophi): Secondary | ICD-10-CM | POA: Diagnosis not present

## 2023-09-07 DIAGNOSIS — L821 Other seborrheic keratosis: Secondary | ICD-10-CM | POA: Diagnosis not present

## 2023-09-09 DIAGNOSIS — Z853 Personal history of malignant neoplasm of breast: Secondary | ICD-10-CM | POA: Diagnosis not present

## 2023-09-09 DIAGNOSIS — J309 Allergic rhinitis, unspecified: Secondary | ICD-10-CM | POA: Diagnosis not present

## 2023-09-09 DIAGNOSIS — F411 Generalized anxiety disorder: Secondary | ICD-10-CM | POA: Diagnosis not present

## 2023-09-09 DIAGNOSIS — M1A9XX0 Chronic gout, unspecified, without tophus (tophi): Secondary | ICD-10-CM | POA: Diagnosis not present

## 2023-09-09 DIAGNOSIS — K219 Gastro-esophageal reflux disease without esophagitis: Secondary | ICD-10-CM | POA: Diagnosis not present

## 2023-09-09 DIAGNOSIS — K589 Irritable bowel syndrome without diarrhea: Secondary | ICD-10-CM | POA: Diagnosis not present

## 2023-09-09 DIAGNOSIS — E559 Vitamin D deficiency, unspecified: Secondary | ICD-10-CM | POA: Diagnosis not present

## 2023-09-09 DIAGNOSIS — Z48817 Encounter for surgical aftercare following surgery on the skin and subcutaneous tissue: Secondary | ICD-10-CM | POA: Diagnosis not present

## 2023-09-09 DIAGNOSIS — I10 Essential (primary) hypertension: Secondary | ICD-10-CM | POA: Diagnosis not present

## 2023-09-09 DIAGNOSIS — Z85828 Personal history of other malignant neoplasm of skin: Secondary | ICD-10-CM | POA: Diagnosis not present

## 2023-09-09 DIAGNOSIS — M810 Age-related osteoporosis without current pathological fracture: Secondary | ICD-10-CM | POA: Diagnosis not present

## 2023-09-09 DIAGNOSIS — L821 Other seborrheic keratosis: Secondary | ICD-10-CM | POA: Diagnosis not present

## 2023-09-16 DIAGNOSIS — D519 Vitamin B12 deficiency anemia, unspecified: Secondary | ICD-10-CM | POA: Diagnosis not present

## 2023-09-16 DIAGNOSIS — E038 Other specified hypothyroidism: Secondary | ICD-10-CM | POA: Diagnosis not present

## 2023-09-16 DIAGNOSIS — E119 Type 2 diabetes mellitus without complications: Secondary | ICD-10-CM | POA: Diagnosis not present

## 2023-09-16 DIAGNOSIS — E782 Mixed hyperlipidemia: Secondary | ICD-10-CM | POA: Diagnosis not present

## 2023-09-17 ENCOUNTER — Encounter: Payer: Self-pay | Admitting: Podiatry

## 2023-09-17 ENCOUNTER — Ambulatory Visit (INDEPENDENT_AMBULATORY_CARE_PROVIDER_SITE_OTHER): Payer: Medicare Other | Admitting: Podiatry

## 2023-09-17 DIAGNOSIS — B351 Tinea unguium: Secondary | ICD-10-CM | POA: Diagnosis not present

## 2023-09-17 DIAGNOSIS — M79674 Pain in right toe(s): Secondary | ICD-10-CM

## 2023-09-17 DIAGNOSIS — M79675 Pain in left toe(s): Secondary | ICD-10-CM

## 2023-09-18 NOTE — Progress Notes (Signed)
 Subjective: Chief Complaint  Patient presents with   Nail Problem    Patient is here for RFC    89 y.o. returns the office today for painful, elongated, thickened toenails which she cannot trim herself.  She denies any open sores.    Irena Reichmann, DO Last seen: 07/02/2023   Objective: AAO 3, NAD- presents with son  DP/PT pulses palpable, CRT less than 3 seconds Nails hypertrophic, dystrophic, elongated, brittle, discolored 10.   There is tenderness overlying the nails 1-5 bilaterally.  There is no surrounding erythema, drainage or pus or signs of infection today.   No pain with calf compression, swelling, warmth, erythema.  Assessment: Patient presents with symptomatic onychomycosis  Plan: -Treatment options including alternatives, risks, complications were discussed -Nails sharply debrided 10 without any complications or bleeding -Daily foot inspection. -Discussed daily foot inspection. If there are any changes, to call the office immediately.   Return in about 3 months (around 12/17/2023).  Vivi Barrack DPM

## 2023-09-20 DIAGNOSIS — F411 Generalized anxiety disorder: Secondary | ICD-10-CM | POA: Diagnosis not present

## 2023-09-20 DIAGNOSIS — I10 Essential (primary) hypertension: Secondary | ICD-10-CM | POA: Diagnosis not present

## 2023-09-20 DIAGNOSIS — L821 Other seborrheic keratosis: Secondary | ICD-10-CM | POA: Diagnosis not present

## 2023-09-20 DIAGNOSIS — Z48817 Encounter for surgical aftercare following surgery on the skin and subcutaneous tissue: Secondary | ICD-10-CM | POA: Diagnosis not present

## 2023-09-20 DIAGNOSIS — K219 Gastro-esophageal reflux disease without esophagitis: Secondary | ICD-10-CM | POA: Diagnosis not present

## 2023-09-20 DIAGNOSIS — M1A9XX0 Chronic gout, unspecified, without tophus (tophi): Secondary | ICD-10-CM | POA: Diagnosis not present

## 2023-10-04 DIAGNOSIS — Z48817 Encounter for surgical aftercare following surgery on the skin and subcutaneous tissue: Secondary | ICD-10-CM | POA: Diagnosis not present

## 2023-10-04 DIAGNOSIS — K219 Gastro-esophageal reflux disease without esophagitis: Secondary | ICD-10-CM | POA: Diagnosis not present

## 2023-10-04 DIAGNOSIS — F411 Generalized anxiety disorder: Secondary | ICD-10-CM | POA: Diagnosis not present

## 2023-10-04 DIAGNOSIS — I10 Essential (primary) hypertension: Secondary | ICD-10-CM | POA: Diagnosis not present

## 2023-10-04 DIAGNOSIS — M1A9XX0 Chronic gout, unspecified, without tophus (tophi): Secondary | ICD-10-CM | POA: Diagnosis not present

## 2023-10-04 DIAGNOSIS — L821 Other seborrheic keratosis: Secondary | ICD-10-CM | POA: Diagnosis not present

## 2023-10-07 DIAGNOSIS — E559 Vitamin D deficiency, unspecified: Secondary | ICD-10-CM | POA: Diagnosis not present

## 2023-10-07 DIAGNOSIS — I10 Essential (primary) hypertension: Secondary | ICD-10-CM | POA: Diagnosis not present

## 2023-10-07 DIAGNOSIS — K589 Irritable bowel syndrome without diarrhea: Secondary | ICD-10-CM | POA: Diagnosis not present

## 2023-10-07 DIAGNOSIS — M810 Age-related osteoporosis without current pathological fracture: Secondary | ICD-10-CM | POA: Diagnosis not present

## 2023-10-14 DIAGNOSIS — H6123 Impacted cerumen, bilateral: Secondary | ICD-10-CM | POA: Diagnosis not present

## 2023-10-15 DIAGNOSIS — E782 Mixed hyperlipidemia: Secondary | ICD-10-CM | POA: Diagnosis not present

## 2023-10-15 DIAGNOSIS — E038 Other specified hypothyroidism: Secondary | ICD-10-CM | POA: Diagnosis not present

## 2023-10-15 DIAGNOSIS — D519 Vitamin B12 deficiency anemia, unspecified: Secondary | ICD-10-CM | POA: Diagnosis not present

## 2023-10-15 DIAGNOSIS — Z01419 Encounter for gynecological examination (general) (routine) without abnormal findings: Secondary | ICD-10-CM | POA: Diagnosis not present

## 2023-10-15 DIAGNOSIS — E119 Type 2 diabetes mellitus without complications: Secondary | ICD-10-CM | POA: Diagnosis not present

## 2023-10-26 DIAGNOSIS — H409 Unspecified glaucoma: Secondary | ICD-10-CM | POA: Diagnosis not present

## 2023-11-12 DIAGNOSIS — H40022 Open angle with borderline findings, high risk, left eye: Secondary | ICD-10-CM | POA: Diagnosis not present

## 2023-11-12 DIAGNOSIS — H401411 Capsular glaucoma with pseudoexfoliation of lens, right eye, mild stage: Secondary | ICD-10-CM | POA: Diagnosis not present

## 2023-11-12 DIAGNOSIS — H26492 Other secondary cataract, left eye: Secondary | ICD-10-CM | POA: Diagnosis not present

## 2023-11-17 DIAGNOSIS — E119 Type 2 diabetes mellitus without complications: Secondary | ICD-10-CM | POA: Diagnosis not present

## 2023-11-17 DIAGNOSIS — E782 Mixed hyperlipidemia: Secondary | ICD-10-CM | POA: Diagnosis not present

## 2023-11-17 DIAGNOSIS — E038 Other specified hypothyroidism: Secondary | ICD-10-CM | POA: Diagnosis not present

## 2023-11-17 DIAGNOSIS — D519 Vitamin B12 deficiency anemia, unspecified: Secondary | ICD-10-CM | POA: Diagnosis not present

## 2023-11-23 DIAGNOSIS — E782 Mixed hyperlipidemia: Secondary | ICD-10-CM | POA: Diagnosis not present

## 2023-11-23 DIAGNOSIS — Z79899 Other long term (current) drug therapy: Secondary | ICD-10-CM | POA: Diagnosis not present

## 2023-11-23 DIAGNOSIS — D519 Vitamin B12 deficiency anemia, unspecified: Secondary | ICD-10-CM | POA: Diagnosis not present

## 2023-11-23 DIAGNOSIS — E559 Vitamin D deficiency, unspecified: Secondary | ICD-10-CM | POA: Diagnosis not present

## 2023-11-23 DIAGNOSIS — E038 Other specified hypothyroidism: Secondary | ICD-10-CM | POA: Diagnosis not present

## 2023-11-23 DIAGNOSIS — R7309 Other abnormal glucose: Secondary | ICD-10-CM | POA: Diagnosis not present

## 2023-12-02 DIAGNOSIS — H409 Unspecified glaucoma: Secondary | ICD-10-CM | POA: Diagnosis not present

## 2023-12-02 DIAGNOSIS — I10 Essential (primary) hypertension: Secondary | ICD-10-CM | POA: Diagnosis not present

## 2023-12-02 DIAGNOSIS — M810 Age-related osteoporosis without current pathological fracture: Secondary | ICD-10-CM | POA: Diagnosis not present

## 2023-12-16 DIAGNOSIS — E782 Mixed hyperlipidemia: Secondary | ICD-10-CM | POA: Diagnosis not present

## 2023-12-16 DIAGNOSIS — E119 Type 2 diabetes mellitus without complications: Secondary | ICD-10-CM | POA: Diagnosis not present

## 2023-12-16 DIAGNOSIS — E038 Other specified hypothyroidism: Secondary | ICD-10-CM | POA: Diagnosis not present

## 2023-12-16 DIAGNOSIS — D519 Vitamin B12 deficiency anemia, unspecified: Secondary | ICD-10-CM | POA: Diagnosis not present

## 2023-12-18 DIAGNOSIS — M1A9XX0 Chronic gout, unspecified, without tophus (tophi): Secondary | ICD-10-CM | POA: Diagnosis not present

## 2023-12-18 DIAGNOSIS — M810 Age-related osteoporosis without current pathological fracture: Secondary | ICD-10-CM | POA: Diagnosis not present

## 2023-12-18 DIAGNOSIS — I10 Essential (primary) hypertension: Secondary | ICD-10-CM | POA: Diagnosis not present

## 2023-12-18 DIAGNOSIS — Z0000 Encounter for general adult medical examination without abnormal findings: Secondary | ICD-10-CM | POA: Diagnosis not present

## 2023-12-18 DIAGNOSIS — K589 Irritable bowel syndrome without diarrhea: Secondary | ICD-10-CM | POA: Diagnosis not present

## 2023-12-24 DIAGNOSIS — H6123 Impacted cerumen, bilateral: Secondary | ICD-10-CM | POA: Diagnosis not present

## 2023-12-27 DIAGNOSIS — W19XXXA Unspecified fall, initial encounter: Secondary | ICD-10-CM | POA: Diagnosis not present

## 2023-12-27 DIAGNOSIS — S81812A Laceration without foreign body, left lower leg, initial encounter: Secondary | ICD-10-CM | POA: Diagnosis not present

## 2023-12-27 DIAGNOSIS — S61411A Laceration without foreign body of right hand, initial encounter: Secondary | ICD-10-CM | POA: Diagnosis not present

## 2023-12-27 DIAGNOSIS — I10 Essential (primary) hypertension: Secondary | ICD-10-CM | POA: Diagnosis not present

## 2023-12-27 DIAGNOSIS — S81811A Laceration without foreign body, right lower leg, initial encounter: Secondary | ICD-10-CM | POA: Diagnosis not present

## 2023-12-27 DIAGNOSIS — R58 Hemorrhage, not elsewhere classified: Secondary | ICD-10-CM | POA: Diagnosis not present

## 2023-12-27 DIAGNOSIS — S86999A Other injury of unspecified muscle(s) and tendon(s) at lower leg level, unspecified leg, initial encounter: Secondary | ICD-10-CM | POA: Diagnosis not present

## 2023-12-27 DIAGNOSIS — R296 Repeated falls: Secondary | ICD-10-CM | POA: Diagnosis not present

## 2023-12-28 DIAGNOSIS — K8020 Calculus of gallbladder without cholecystitis without obstruction: Secondary | ICD-10-CM | POA: Diagnosis not present

## 2023-12-28 DIAGNOSIS — J45909 Unspecified asthma, uncomplicated: Secondary | ICD-10-CM | POA: Diagnosis not present

## 2023-12-28 DIAGNOSIS — M4802 Spinal stenosis, cervical region: Secondary | ICD-10-CM | POA: Diagnosis not present

## 2023-12-28 DIAGNOSIS — R001 Bradycardia, unspecified: Secondary | ICD-10-CM | POA: Diagnosis not present

## 2023-12-28 DIAGNOSIS — R9082 White matter disease, unspecified: Secondary | ICD-10-CM | POA: Diagnosis not present

## 2023-12-28 DIAGNOSIS — M4803 Spinal stenosis, cervicothoracic region: Secondary | ICD-10-CM | POA: Diagnosis not present

## 2023-12-28 DIAGNOSIS — Z7409 Other reduced mobility: Secondary | ICD-10-CM | POA: Diagnosis not present

## 2023-12-28 DIAGNOSIS — R29818 Other symptoms and signs involving the nervous system: Secondary | ICD-10-CM | POA: Diagnosis not present

## 2023-12-28 DIAGNOSIS — K219 Gastro-esophageal reflux disease without esophagitis: Secondary | ICD-10-CM | POA: Diagnosis not present

## 2023-12-28 DIAGNOSIS — T441X5A Adverse effect of other parasympathomimetics [cholinergics], initial encounter: Secondary | ICD-10-CM | POA: Diagnosis not present

## 2023-12-28 DIAGNOSIS — Z853 Personal history of malignant neoplasm of breast: Secondary | ICD-10-CM | POA: Diagnosis not present

## 2023-12-28 DIAGNOSIS — S81811A Laceration without foreign body, right lower leg, initial encounter: Secondary | ICD-10-CM | POA: Diagnosis not present

## 2023-12-28 DIAGNOSIS — I878 Other specified disorders of veins: Secondary | ICD-10-CM | POA: Diagnosis not present

## 2023-12-28 DIAGNOSIS — G8929 Other chronic pain: Secondary | ICD-10-CM | POA: Diagnosis not present

## 2023-12-28 DIAGNOSIS — I2510 Atherosclerotic heart disease of native coronary artery without angina pectoris: Secondary | ICD-10-CM | POA: Diagnosis not present

## 2023-12-28 DIAGNOSIS — I499 Cardiac arrhythmia, unspecified: Secondary | ICD-10-CM | POA: Diagnosis not present

## 2023-12-28 DIAGNOSIS — Z743 Need for continuous supervision: Secondary | ICD-10-CM | POA: Diagnosis not present

## 2023-12-28 DIAGNOSIS — I10 Essential (primary) hypertension: Secondary | ICD-10-CM | POA: Diagnosis not present

## 2023-12-28 DIAGNOSIS — E785 Hyperlipidemia, unspecified: Secondary | ICD-10-CM | POA: Diagnosis not present

## 2023-12-28 DIAGNOSIS — S81819A Laceration without foreign body, unspecified lower leg, initial encounter: Secondary | ICD-10-CM | POA: Diagnosis not present

## 2023-12-28 DIAGNOSIS — S61411A Laceration without foreign body of right hand, initial encounter: Secondary | ICD-10-CM | POA: Diagnosis not present

## 2023-12-28 DIAGNOSIS — R55 Syncope and collapse: Secondary | ICD-10-CM | POA: Diagnosis not present

## 2023-12-28 DIAGNOSIS — M47812 Spondylosis without myelopathy or radiculopathy, cervical region: Secondary | ICD-10-CM | POA: Diagnosis not present

## 2023-12-28 DIAGNOSIS — K5710 Diverticulosis of small intestine without perforation or abscess without bleeding: Secondary | ICD-10-CM | POA: Diagnosis not present

## 2023-12-28 DIAGNOSIS — M5030 Other cervical disc degeneration, unspecified cervical region: Secondary | ICD-10-CM | POA: Diagnosis not present

## 2023-12-28 DIAGNOSIS — W19XXXA Unspecified fall, initial encounter: Secondary | ICD-10-CM | POA: Diagnosis not present

## 2023-12-28 DIAGNOSIS — F0390 Unspecified dementia without behavioral disturbance: Secondary | ICD-10-CM | POA: Diagnosis not present

## 2023-12-28 DIAGNOSIS — I498 Other specified cardiac arrhythmias: Secondary | ICD-10-CM | POA: Diagnosis not present

## 2023-12-28 NOTE — Progress Notes (Signed)
   12/28/23 1817 12/28/23 1820 12/28/23 1822  Vitals  Heart Rate 87 87 94  BP (!) 131/93 148/89 (!) 148/103  MAP (mmHg) 103 104 110  Patient Position Lying Sitting Standing   Pt arrived on floor, transferred from ED.  Patient is A&OX4 and in no acute distress. Pt states pain denies. PERRLA, Mucous membranes pink, moist with no signs of breakdown noted. RLE with dressing intact with sutures + bruising to BUE, blanchable redness to sacral area.  Orthostatic BP VS completed as shown above. Pt denies any dizziness or lightheadedness.   Patient oriented to room, unit, and call bell system. Bedside table, personal belongings, call bell placed within reach of patient. Patient instructed to call for assistance. Patient in bed. Will continue to monitor and make frequent rounds.

## 2023-12-28 NOTE — Care Plan (Signed)
 Problem: PAIN - ADULT  Goal: Verbalizes/displays adequate comfort level or baseline comfort level  Description: INTERVENTIONS:  1. Encourage pt to monitor pain and request assistance  2. Assess pain using appropriate pain scale  3. Administer analgesics based on type and severity of pain and evaluate response  4. Implement non-pharmacological measures as appropriate and evaluate response  5. Consider cultural and social influences on pain and pain management  6. Notify LIP if interventions unsuccessful or patient reports new pain  Outcome: Progressing     Problem: INFECTION - ADULT  Goal: Absence of infection during hospitalization  Description: INTERVENTIONS:  1. Assess and monitor for signs and symptoms of infection  2. Monitor lab/diagnostic results  3. Monitor all insertion sites i.e., indwelling lines, tubes and drains  4. Monitor endotracheal (as able) and nasal secretions for changes in amount and color  5. Institute appropriate cooling/warming therapies per order  6. Administer medications as ordered  7. Instruct and encourage patient and family to use good hand hygiene technique  8. Identify and instruct in appropriate isolation precautions for identified infection/condition  Outcome: Progressing  Goal: Absence of fever/infection during anticipated neutropenic period  Description: INTERVENTIONS  1. Monitor WBC  2. Administer growth factors as ordered  3. Implement neutropenic guidelines as ordered  Outcome: Progressing     Problem: Safety - Adult  Goal: Free from fall injury  Description: INTERVENTIONS:  1. Assess pt frequently for physical needs  2. Identify cognitive and physical deficits and behaviors that affect risk of falls.  3. Institute fall precautions as indicated by assessment.  4. Educate pt/family on patient safety including physical limitations  5. Instruct pt to call for assistance with activity based on assessment  6. Modify environment to reduce risk of injury  7. Consider OT/PT consult  to assist with strengthening/mobility  Outcome: Progressing  Goal: Absence of infection during hospitalization  Description: INTERVENTIONS:  1. Assess and monitor for signs and symptoms of infection  2. Monitor lab/diagnostic results  3. Monitor all insertion sites i.e., indwelling lines, tubes and drains  4. Monitor endotracheal (as able) and nasal secretions for changes in amount and color  5. Institute appropriate cooling/warming therapies per order  6. Administer medications as ordered  7. Instruct and encourage patient and family to use good hand hygiene technique  8. Identify and instruct in appropriate isolation precautions for identified infection/condition  Outcome: Progressing     Problem: DISCHARGE PLANNING  Goal: Discharge to home or other facility with appropriate resources  Description: INTERVENTIONS:  1. Identify barriers to discharge w/pt and caregiver  2. Arrange for needed discharge resources and transportation as appropriate  3. Identify discharge learning needs (meds, wound care, etc)  4. Arrange for interpreters to assist at discharge as needed  5. Refer to Case Management Department for coordinating discharge planning if the patient needs post-hospital services based on physician order or complex needs related to functional status, cognitive ability or social support system  Outcome: Progressing     Problem: Chronic Conditions and Co-Morbidities  Goal: Patient's chronic conditions and co-morbidity symptoms are monitored and maintained or improved  Description: INTERVENTIONS:  1. Monitor and assess patient's chronic conditions and comorbid symptoms for stability, deterioration, or improvement  2. Collaborate with multidisciplinary team to address chronic and comorbid conditions and prevent exacerbation or deterioration  3. Update acute care plan with appropriate goals if chronic or comorbid symptoms are exacerbated and prevent overall improvement and discharge  Outcome: Progressing

## 2023-12-28 NOTE — Progress Notes (Signed)
 Division of Pharmacy Services  Patient Name: Nancy Webb  Age: 89 y.o.   [facility] ED  Allergies: Loratadine  Admission History Admission Medication List was Completed  Medications removed from PTA medlist: Asprin, omeprazole , rosuvastatin, Vesicare, and Detrol La were removed. Reason:   Explanation:   Medication Documentation Review Audit     Reviewed by Flint SHAUNNA Dines, CPhT (Pharmacy Technician) on 12/28/23 at 1600  Med List Status: Pharm Tech complete         Reviewed by Rock Dannielle Birmingham, MD (Physician) on 12/28/23 at 1139  Med List Status: <None>           Audit from Redirected Encounters   **Prior to Admission medications have not yet been reviewed for this encounter**    Prior to Admission Medications     Reviewed by Flint SHAUNNA Dines, CPhT on 12/28/23 at 1600    Medication Sig Last Dose Informant Taking? Status  amLODIPine (NORVASC) 5 mg tablet Take 5 mg by mouth daily. 12/28/2023  9:30 AM  Yes Active         Med Note (PINEDA, SOFIA P   Tue Dec 28, 2023  3:43 PM) 0930  busPIRone (BUSPAR) 5 mg tablet Take 5 mg by mouth daily. 12/28/2023  9:30 AM  Yes Active         Med Note (PINEDA, SOFIA P   Tue Dec 28, 2023  3:44 PM) 0930  cholecalciferol (VITAMIN D3) 1,250 mcg (50,000 unit) capsule Take 50,000 Units by mouth every Friday. 12/24/2023  9:30 AM  No Active         Med Note (PINEDA, SOFIA P   Tue Dec 28, 2023  3:44 PM) 0930  donepeziL  (ARICEPT ) 5 mg tablet Take 5 mg by mouth at bedtime. 12/27/2023  9:00 PM  Yes Active         Med Note (PINEDA, SOFIA P   Tue Dec 28, 2023  3:46 PM) 2100  esomeprazole (NexIUM) 40 mg DR capsule Take 40 mg by mouth every morning before breakfast. 12/28/2023  6:00 AM  Yes Active         Med Note (PINEDA, SOFIA P   Tue Dec 28, 2023  3:47 PM) 0600  hyoscyamine  (LEVSIN ) 0.125 mg tablet Take 0.125 mg by mouth every 6 (six) hours as needed for cramping. Unknown  No Active  irbesartan (AVAPRO) 300 mg tablet Take 300 mg by mouth daily.  12/28/2023  9:30 AM  Yes Active         Med Note (PINEDA, SOFIA P   Tue Dec 28, 2023  3:48 PM) 0930  latanoprost (XALATAN) 0.005 % ophthalmic solution Administer 1 drop into both eyes at bedtime. 12/27/2023  9:30 PM  Yes Active  methylPREDNISolone  (MEDROL  DOSEPAK) 4 mg 6 day dose pack follow package directions   Yes Flag for Review (Patient Discharge)  montelukast (SINGULAIR) 10 mg tablet Take 10 mg by mouth daily. 12/28/2023  9:30 AM  Yes Active         Med Note (PINEDA, SOFIA P   Tue Dec 28, 2023  3:51 PM) 0930  nutritional drink (Ensure) liqd liquid Take 8 mL by mouth daily. 12/28/2023  9:30 AM  Yes Active         Med Note (PINEDA, SOFIA P   Tue Dec 28, 2023  3:47 PM) 0930  ondansetron (ZOFRAN) 8 mg tablet Take 8 mg by mouth every 8 (eight) hours as needed for nausea or vomiting. Unknown  No Active  solifenacin (  Vesicare) 5 mg tablet Take by mouth.    Flag for Review  timolol (TIMOPTIC) 0.5 % ophthalmic solution Administer 1 drop into the right eye at bedtime. 12/27/2023  8:00 PM  Yes Active         Med Note (PINEDA, SOFIA P   Tue Dec 28, 2023  3:59 PM) 2000           Audit from Redirected Encounters   **Prior to Admission medications have not yet been reviewed for this encounter**     Patients' primary pharmacy/pharmacies No Pharmacies Listed  Confidence level in the medication list: Confident  Comments:  Medications and allergies verified with MAR from [facility]. Ensure shake, Areicept, Zofran, esomeprazole magnesium, buspirone, Vitamin D-3, Timoptic eye drops, and Levsin  were added to PTA list. Asprin, omeprazole , rosuvastatin, Vesicare, and Detrol La were removed. These medications were no longer on MAR. Messaged Channing Quest, [facility]  Electronically signed by: Flint SHAUNNA Dines, CPhT 12/28/2023 4:00 PM

## 2023-12-28 NOTE — H&P (Signed)
 [facility] Hospitalist History and Physical  Assessment/Plan:  Principal Problem:   Syncope and collapse Resolved Problems:   * No resolved hospital problems. *   Nancy Webb is a 89 y.o. female with PMHx as reviewed in the EMR that presented to [facility] with  Chief Complaint  Patient presents with  . Loss of Consciousness   Is being admitted with Syncope and collapse  Syncope.  Presentation seems to be in favor of orthostatic hypotension.  Hold off antihypertensive.  Hydrate the patient.  Obtain orthostatic vitals.  Monitor on telemetry and obtain 2D echo.  CT head negative for acute intraocular pathology.  EKG consistent with sinus bradycardia with nonspecific changes.  History of hypertension.  Patient was found to be hypotensive and had an episode of syncope likely related to hypotension.  Hold off antihypertensive.  Monitor blood pressure closely and resume antihypertensive at lower dose if needed.  Mild hyponatremia.  Probably not clinically significant.  Gently hydrate the patient and monitor BMP closely.  If dropping then consider further workup.  Recent fall/trauma.  Obtain PT/OT consult.  Patient has stitches/dressing in right lower extremity--at the [facility] yesterday.  Observation  DVT prophylaxis:    enoxaparin Anticipated disposition: To Skilled Nursing Facility Estimated discharge:    1-2 days  CODE status: Patient is Patient/surrogate desires Full Code after discussion on the day of admission.  ___________________________________________________________________  Chief Complaint: Chief Complaint  Patient presents with  . Loss of Consciousness    HPI: Nancy Webb is a 89 y.o. female with PMHx as reviewed in the EMR that presented to [facility] with  Chief Complaint  Patient presents with  . Loss of Consciousness  . 89 year old female patient who was brought in with syncopal episode.  History was obtained from the daughter around bedside as  patient does not remember the event.  Patient lives at [facility] and she had a fall/possible syncope last night resulting in 2 trauma to lower extremity on the right side with bleeding and was taken to the [facility] for evaluation.  Patient had stitches placed and sent back to skilled nursing facility.  Today patient was sitting in the chair and daughter visited to see her mom and found syncopal episode.  As per daughter patient was sitting on the side of the bed and she was groggy/sleepy followed by slumping and not responding.  Daughter noted significantly diaphoretic with pale and brief.  Of loss of consciousness.  Daughter reports having blood pressure systolic in low 60s and heart rate dropping low 30s.  EMS was called and patient was brought into the ER for further evaluation.  After EMS arrived within 3 to 4 minutes patient started getting better.  Daughter reports they laid her flat with the elevated legs which improved her mentation.  Daughter denies any seizure-like activity but does report some postural stiffness.  Daughter denies any seizure-like activity/tongue bite/urinary and fecal incontinence.  After mentation improved patient was slightly confused for very short time.  Denies any chest pain, shortness of breath, nausea, vomiting, fever, cough, abdominal pain, constipation/diarrhea, frequent/burning urination.   Allergies: Loratadine  Medications:  Reviewed  Medical History: Medical History[1]  Surgical History: Surgical History[2]  Social History: Social History   Socioeconomic History  . Marital status: Married    Spouse name: Not on file  . Number of children: Not on file  . Years of education: Not on file  . Highest education level: Not on file  Occupational History  .  Not on file  Tobacco Use  . Smoking status: Never  . Smokeless tobacco: Not on file  Substance and Sexual Activity  . Alcohol use: Yes  . Drug use: No  . Sexual activity: Not on  file  Other Topics Concern  . Not on file  Social History Narrative  . Not on file   Social Drivers of Health   Food Insecurity: Not on file  Transportation Needs: Not on file  Safety: Not on file  Living Situation: Not on file    Family History: Family History[3]  Review of Systems: A complete review of organ systems is negative unless otherwise mentioned in HPI  Labs/Studies: Labs and Studies from the last 24hrs per EMR and Personally Reviewed by me  Physical Exam: Temp:  [97.6 F (36.4 C)] 97.6 F (36.4 C) Heart Rate:  [66] 66 Resp:  [16] 16 BP: (121)/(75) 121/75   GEN: NAD, lying in bed EYES: EOMI ENT: MMM CV: RRR, no murmurs appreciated PULM: Fair bilateral air entry noted ABD: soft, NT/ND, +BS EXT: No edema NEURO: No focal deficits PSYCH: Patient is awake, alert and oriented. GU: No CVA tenderness MSK: Dressing present over the right lower extremity status post recent stretching at [facility]          [1] Past Medical History: Diagnosis Date  . Asthma (CMD)   . Breast cancer    (CMD)   . GERD (gastroesophageal reflux disease)   . Hyperlipemia   . Hypertension   [2] Past Surgical History: Procedure Laterality Date  . BREAST SURGERY     Procedure: BREAST SURGERY  [3] No family history on file.

## 2023-12-29 DIAGNOSIS — I878 Other specified disorders of veins: Secondary | ICD-10-CM | POA: Diagnosis not present

## 2023-12-29 DIAGNOSIS — R55 Syncope and collapse: Secondary | ICD-10-CM | POA: Diagnosis not present

## 2023-12-29 NOTE — Care Plan (Signed)
  Problem: PAIN - ADULT Goal: Verbalizes/displays adequate comfort level or baseline comfort level Description: INTERVENTIONS: 1. Encourage pt to monitor pain and request assistance 2. Assess pain using appropriate pain scale 3. Administer analgesics based on type and severity of pain and evaluate response 4. Implement non-pharmacological measures as appropriate and evaluate response 5. Consider cultural and social influences on pain and pain management 6. Notify LIP if interventions unsuccessful or patient reports new pain Outcome: Progressing   Problem: INFECTION - ADULT Goal: Absence of infection during hospitalization Description: INTERVENTIONS: 1. Assess and monitor for signs and symptoms of infection 2. Monitor lab/diagnostic results 3. Monitor all insertion sites i.e., indwelling lines, tubes and drains 4. Monitor endotracheal (as able) and nasal secretions for changes in amount and color 5. Institute appropriate cooling/warming therapies per order 6. Administer medications as ordered 7. Instruct and encourage patient and family to use good hand hygiene technique 8. Identify and instruct in appropriate isolation precautions for identified infection/condition Outcome: Progressing Goal: Absence of fever/infection during anticipated neutropenic period Description: INTERVENTIONS 1. Monitor WBC 2. Administer growth factors as ordered 3. Implement neutropenic guidelines as ordered Outcome: Progressing   Problem: Safety - Adult Goal: Free from fall injury Description: INTERVENTIONS: 1. Assess pt frequently for physical needs 2. Identify cognitive and physical deficits and behaviors that affect risk of falls. 3. Institute fall precautions as indicated by assessment. 4. Educate pt/family on patient safety including physical limitations 5. Instruct pt to call for assistance with activity based on assessment 6. Modify environment to reduce risk of injury 7. Consider OT/PT consult  to assist with strengthening/mobility Outcome: Progressing Goal: Absence of infection during hospitalization Description: INTERVENTIONS: 1. Assess and monitor for signs and symptoms of infection 2. Monitor lab/diagnostic results 3. Monitor all insertion sites i.e., indwelling lines, tubes and drains 4. Monitor endotracheal (as able) and nasal secretions for changes in amount and color 5. Institute appropriate cooling/warming therapies per order 6. Administer medications as ordered 7. Instruct and encourage patient and family to use good hand hygiene technique 8. Identify and instruct in appropriate isolation precautions for identified infection/condition Outcome: Progressing   Problem: DISCHARGE PLANNING Goal: Discharge to home or other facility with appropriate resources Description: INTERVENTIONS: 1. Identify barriers to discharge w/pt and caregiver 2. Arrange for needed discharge resources and transportation as appropriate 3. Identify discharge learning needs (meds, wound care, etc) 4. Arrange for interpreters to assist at discharge as needed 5. Refer to Case Management Department for coordinating discharge planning if the patient needs post-hospital services based on physician order or complex needs related to functional status, cognitive ability or social support system Outcome: Progressing   Problem: Chronic Conditions and Co-Morbidities Goal: Patient's chronic conditions and co-morbidity symptoms are monitored and maintained or improved Description: INTERVENTIONS: 1. Monitor and assess patient's chronic conditions and comorbid symptoms for stability, deterioration, or improvement 2. Collaborate with multidisciplinary team to address chronic and comorbid conditions and prevent exacerbation or deterioration 3. Update acute care plan with appropriate goals if chronic or comorbid symptoms are exacerbated and prevent overall improvement and discharge Outcome: Progressing   Problem:  Health Behavior: Goal: MCB Ability to state ways to decrease the risk of falls will be met by discharge Description: Ability to state ways to decrease the risk of falls will improve by discharge Outcome: Progressing   Problem: Safety: Goal: Will remain free from falls by discharge Description: Will remain free from falls by discharge Outcome: Progressing

## 2023-12-30 DIAGNOSIS — R55 Syncope and collapse: Secondary | ICD-10-CM | POA: Diagnosis not present

## 2023-12-31 ENCOUNTER — Ambulatory Visit: Admitting: Podiatry

## 2023-12-31 NOTE — Transitions of Care (Post Inpatient/ED Visit) (Signed)
 12/31/2023  Patient ID: Nancy Webb, female   DOB: 18-Dec-1931, 89 y.o.   MRN: [id number]  Chart Review for transitions of care. Patient discharged to [facility].    Leeman Johnsey J. Chantale Leugers RN, MSN [facility], [facility] RN Care Manager Direct Dial: [phone]  Fax: [phone] Website: [email or web address]

## 2024-01-05 DIAGNOSIS — K219 Gastro-esophageal reflux disease without esophagitis: Secondary | ICD-10-CM | POA: Diagnosis not present

## 2024-01-05 DIAGNOSIS — S81811D Laceration without foreign body, right lower leg, subsequent encounter: Secondary | ICD-10-CM | POA: Diagnosis not present

## 2024-01-05 DIAGNOSIS — M1A9XX0 Chronic gout, unspecified, without tophus (tophi): Secondary | ICD-10-CM | POA: Diagnosis not present

## 2024-01-05 DIAGNOSIS — I10 Essential (primary) hypertension: Secondary | ICD-10-CM | POA: Diagnosis not present

## 2024-01-05 DIAGNOSIS — E559 Vitamin D deficiency, unspecified: Secondary | ICD-10-CM | POA: Diagnosis not present

## 2024-01-05 DIAGNOSIS — M810 Age-related osteoporosis without current pathological fracture: Secondary | ICD-10-CM | POA: Diagnosis not present

## 2024-01-05 DIAGNOSIS — F0394 Unspecified dementia, unspecified severity, with anxiety: Secondary | ICD-10-CM | POA: Diagnosis not present

## 2024-01-05 DIAGNOSIS — K589 Irritable bowel syndrome without diarrhea: Secondary | ICD-10-CM | POA: Diagnosis not present

## 2024-01-05 DIAGNOSIS — W19XXXD Unspecified fall, subsequent encounter: Secondary | ICD-10-CM | POA: Diagnosis not present

## 2024-01-05 DIAGNOSIS — F411 Generalized anxiety disorder: Secondary | ICD-10-CM | POA: Diagnosis not present

## 2024-01-05 DIAGNOSIS — Z853 Personal history of malignant neoplasm of breast: Secondary | ICD-10-CM | POA: Diagnosis not present

## 2024-01-06 DIAGNOSIS — R001 Bradycardia, unspecified: Secondary | ICD-10-CM | POA: Diagnosis not present

## 2024-01-06 DIAGNOSIS — S81812A Laceration without foreign body, left lower leg, initial encounter: Secondary | ICD-10-CM | POA: Diagnosis not present

## 2024-01-06 DIAGNOSIS — T50905A Adverse effect of unspecified drugs, medicaments and biological substances, initial encounter: Secondary | ICD-10-CM | POA: Diagnosis not present

## 2024-01-07 DIAGNOSIS — M810 Age-related osteoporosis without current pathological fracture: Secondary | ICD-10-CM | POA: Diagnosis not present

## 2024-01-07 DIAGNOSIS — I10 Essential (primary) hypertension: Secondary | ICD-10-CM | POA: Diagnosis not present

## 2024-01-07 DIAGNOSIS — M1A9XX0 Chronic gout, unspecified, without tophus (tophi): Secondary | ICD-10-CM | POA: Diagnosis not present

## 2024-01-07 DIAGNOSIS — S81811D Laceration without foreign body, right lower leg, subsequent encounter: Secondary | ICD-10-CM | POA: Diagnosis not present

## 2024-01-07 DIAGNOSIS — F411 Generalized anxiety disorder: Secondary | ICD-10-CM | POA: Diagnosis not present

## 2024-01-07 DIAGNOSIS — F0394 Unspecified dementia, unspecified severity, with anxiety: Secondary | ICD-10-CM | POA: Diagnosis not present

## 2024-01-10 DIAGNOSIS — I10 Essential (primary) hypertension: Secondary | ICD-10-CM | POA: Diagnosis not present

## 2024-01-10 DIAGNOSIS — F0394 Unspecified dementia, unspecified severity, with anxiety: Secondary | ICD-10-CM | POA: Diagnosis not present

## 2024-01-10 DIAGNOSIS — M1A9XX0 Chronic gout, unspecified, without tophus (tophi): Secondary | ICD-10-CM | POA: Diagnosis not present

## 2024-01-10 DIAGNOSIS — M810 Age-related osteoporosis without current pathological fracture: Secondary | ICD-10-CM | POA: Diagnosis not present

## 2024-01-10 DIAGNOSIS — F411 Generalized anxiety disorder: Secondary | ICD-10-CM | POA: Diagnosis not present

## 2024-01-10 DIAGNOSIS — S81811D Laceration without foreign body, right lower leg, subsequent encounter: Secondary | ICD-10-CM | POA: Diagnosis not present

## 2024-01-11 DIAGNOSIS — S81811D Laceration without foreign body, right lower leg, subsequent encounter: Secondary | ICD-10-CM | POA: Diagnosis not present

## 2024-01-11 DIAGNOSIS — M810 Age-related osteoporosis without current pathological fracture: Secondary | ICD-10-CM | POA: Diagnosis not present

## 2024-01-11 DIAGNOSIS — M1A9XX0 Chronic gout, unspecified, without tophus (tophi): Secondary | ICD-10-CM | POA: Diagnosis not present

## 2024-01-11 DIAGNOSIS — F0394 Unspecified dementia, unspecified severity, with anxiety: Secondary | ICD-10-CM | POA: Diagnosis not present

## 2024-01-11 DIAGNOSIS — F411 Generalized anxiety disorder: Secondary | ICD-10-CM | POA: Diagnosis not present

## 2024-01-11 DIAGNOSIS — I10 Essential (primary) hypertension: Secondary | ICD-10-CM | POA: Diagnosis not present

## 2024-01-12 DIAGNOSIS — S81811D Laceration without foreign body, right lower leg, subsequent encounter: Secondary | ICD-10-CM | POA: Diagnosis not present

## 2024-01-12 DIAGNOSIS — F411 Generalized anxiety disorder: Secondary | ICD-10-CM | POA: Diagnosis not present

## 2024-01-12 DIAGNOSIS — F0394 Unspecified dementia, unspecified severity, with anxiety: Secondary | ICD-10-CM | POA: Diagnosis not present

## 2024-01-12 DIAGNOSIS — M1A9XX0 Chronic gout, unspecified, without tophus (tophi): Secondary | ICD-10-CM | POA: Diagnosis not present

## 2024-01-12 DIAGNOSIS — I10 Essential (primary) hypertension: Secondary | ICD-10-CM | POA: Diagnosis not present

## 2024-01-12 DIAGNOSIS — M810 Age-related osteoporosis without current pathological fracture: Secondary | ICD-10-CM | POA: Diagnosis not present

## 2024-01-13 DIAGNOSIS — M1A9XX0 Chronic gout, unspecified, without tophus (tophi): Secondary | ICD-10-CM | POA: Diagnosis not present

## 2024-01-13 DIAGNOSIS — F411 Generalized anxiety disorder: Secondary | ICD-10-CM | POA: Diagnosis not present

## 2024-01-13 DIAGNOSIS — M810 Age-related osteoporosis without current pathological fracture: Secondary | ICD-10-CM | POA: Diagnosis not present

## 2024-01-13 DIAGNOSIS — L03119 Cellulitis of unspecified part of limb: Secondary | ICD-10-CM | POA: Diagnosis not present

## 2024-01-13 DIAGNOSIS — F0394 Unspecified dementia, unspecified severity, with anxiety: Secondary | ICD-10-CM | POA: Diagnosis not present

## 2024-01-13 DIAGNOSIS — T8130XA Disruption of wound, unspecified, initial encounter: Secondary | ICD-10-CM | POA: Diagnosis not present

## 2024-01-13 DIAGNOSIS — I10 Essential (primary) hypertension: Secondary | ICD-10-CM | POA: Diagnosis not present

## 2024-01-13 DIAGNOSIS — S81811D Laceration without foreign body, right lower leg, subsequent encounter: Secondary | ICD-10-CM | POA: Diagnosis not present

## 2024-01-17 DIAGNOSIS — M810 Age-related osteoporosis without current pathological fracture: Secondary | ICD-10-CM | POA: Diagnosis not present

## 2024-01-17 DIAGNOSIS — F411 Generalized anxiety disorder: Secondary | ICD-10-CM | POA: Diagnosis not present

## 2024-01-17 DIAGNOSIS — F0394 Unspecified dementia, unspecified severity, with anxiety: Secondary | ICD-10-CM | POA: Diagnosis not present

## 2024-01-17 DIAGNOSIS — M1A9XX0 Chronic gout, unspecified, without tophus (tophi): Secondary | ICD-10-CM | POA: Diagnosis not present

## 2024-01-17 DIAGNOSIS — S81811D Laceration without foreign body, right lower leg, subsequent encounter: Secondary | ICD-10-CM | POA: Diagnosis not present

## 2024-01-17 DIAGNOSIS — I10 Essential (primary) hypertension: Secondary | ICD-10-CM | POA: Diagnosis not present

## 2024-01-18 DIAGNOSIS — I10 Essential (primary) hypertension: Secondary | ICD-10-CM | POA: Diagnosis not present

## 2024-01-18 DIAGNOSIS — F0394 Unspecified dementia, unspecified severity, with anxiety: Secondary | ICD-10-CM | POA: Diagnosis not present

## 2024-01-18 DIAGNOSIS — M1A9XX0 Chronic gout, unspecified, without tophus (tophi): Secondary | ICD-10-CM | POA: Diagnosis not present

## 2024-01-18 DIAGNOSIS — S81811D Laceration without foreign body, right lower leg, subsequent encounter: Secondary | ICD-10-CM | POA: Diagnosis not present

## 2024-01-18 DIAGNOSIS — M810 Age-related osteoporosis without current pathological fracture: Secondary | ICD-10-CM | POA: Diagnosis not present

## 2024-01-18 DIAGNOSIS — F411 Generalized anxiety disorder: Secondary | ICD-10-CM | POA: Diagnosis not present

## 2024-01-18 DIAGNOSIS — A4902 Methicillin resistant Staphylococcus aureus infection, unspecified site: Secondary | ICD-10-CM | POA: Diagnosis not present

## 2024-01-19 DIAGNOSIS — E119 Type 2 diabetes mellitus without complications: Secondary | ICD-10-CM | POA: Diagnosis not present

## 2024-01-19 DIAGNOSIS — S81811D Laceration without foreign body, right lower leg, subsequent encounter: Secondary | ICD-10-CM | POA: Diagnosis not present

## 2024-01-19 DIAGNOSIS — I10 Essential (primary) hypertension: Secondary | ICD-10-CM | POA: Diagnosis not present

## 2024-01-19 DIAGNOSIS — M1A9XX0 Chronic gout, unspecified, without tophus (tophi): Secondary | ICD-10-CM | POA: Diagnosis not present

## 2024-01-19 DIAGNOSIS — D519 Vitamin B12 deficiency anemia, unspecified: Secondary | ICD-10-CM | POA: Diagnosis not present

## 2024-01-19 DIAGNOSIS — F411 Generalized anxiety disorder: Secondary | ICD-10-CM | POA: Diagnosis not present

## 2024-01-19 DIAGNOSIS — E782 Mixed hyperlipidemia: Secondary | ICD-10-CM | POA: Diagnosis not present

## 2024-01-19 DIAGNOSIS — M810 Age-related osteoporosis without current pathological fracture: Secondary | ICD-10-CM | POA: Diagnosis not present

## 2024-01-19 DIAGNOSIS — F0394 Unspecified dementia, unspecified severity, with anxiety: Secondary | ICD-10-CM | POA: Diagnosis not present

## 2024-01-19 DIAGNOSIS — E038 Other specified hypothyroidism: Secondary | ICD-10-CM | POA: Diagnosis not present

## 2024-01-20 DIAGNOSIS — S81811D Laceration without foreign body, right lower leg, subsequent encounter: Secondary | ICD-10-CM | POA: Diagnosis not present

## 2024-01-20 DIAGNOSIS — M810 Age-related osteoporosis without current pathological fracture: Secondary | ICD-10-CM | POA: Diagnosis not present

## 2024-01-20 DIAGNOSIS — F0394 Unspecified dementia, unspecified severity, with anxiety: Secondary | ICD-10-CM | POA: Diagnosis not present

## 2024-01-20 DIAGNOSIS — M1A9XX0 Chronic gout, unspecified, without tophus (tophi): Secondary | ICD-10-CM | POA: Diagnosis not present

## 2024-01-20 DIAGNOSIS — I10 Essential (primary) hypertension: Secondary | ICD-10-CM | POA: Diagnosis not present

## 2024-01-20 DIAGNOSIS — F411 Generalized anxiety disorder: Secondary | ICD-10-CM | POA: Diagnosis not present

## 2024-01-21 ENCOUNTER — Ambulatory Visit (INDEPENDENT_AMBULATORY_CARE_PROVIDER_SITE_OTHER): Admitting: Podiatry

## 2024-01-21 VITALS — Ht 61.5 in | Wt 143.0 lb

## 2024-01-21 DIAGNOSIS — M79675 Pain in left toe(s): Secondary | ICD-10-CM

## 2024-01-21 DIAGNOSIS — M79674 Pain in right toe(s): Secondary | ICD-10-CM

## 2024-01-21 DIAGNOSIS — B351 Tinea unguium: Secondary | ICD-10-CM | POA: Diagnosis not present

## 2024-01-22 NOTE — Progress Notes (Signed)
 Subjective: Chief Complaint  Patient presents with   Nail Problem    RM 12 Patient is here for onychomycosis of the nails and nail trimming.    89 y.o. returns the office today for painful, elongated, thickened toenails which she cannot trim herself.  She denies any open sores.    [name], DO Last seen: 07/02/2023   Objective: AAO 3, NAD- presents with son  DP/PT pulses palpable, CRT less than 3 seconds Nails hypertrophic, dystrophic, elongated, brittle, discolored 10.   There is tenderness overlying the nails 1-5 bilaterally.  Mild incurvation present on the hallux toenails without any signs of infection.  There is no surrounding erythema, drainage or pus or signs of infection today.   No pain with calf compression, swelling, warmth, erythema.  Assessment: Patient presents with symptomatic onychomycosis  Plan: -Treatment options including alternatives, risks, complications were discussed -Nails sharply debrided 10 without any complications or bleeding -Daily foot inspection. -Discussed daily foot inspection. If there are any changes, to call the office immediately.   Return in about 3 months (around 04/22/2024).  Donnice JONELLE Fees DPM

## 2024-01-24 DIAGNOSIS — F411 Generalized anxiety disorder: Secondary | ICD-10-CM | POA: Diagnosis not present

## 2024-01-24 DIAGNOSIS — S81811D Laceration without foreign body, right lower leg, subsequent encounter: Secondary | ICD-10-CM | POA: Diagnosis not present

## 2024-01-24 DIAGNOSIS — M810 Age-related osteoporosis without current pathological fracture: Secondary | ICD-10-CM | POA: Diagnosis not present

## 2024-01-24 DIAGNOSIS — M1A9XX0 Chronic gout, unspecified, without tophus (tophi): Secondary | ICD-10-CM | POA: Diagnosis not present

## 2024-01-24 DIAGNOSIS — F0394 Unspecified dementia, unspecified severity, with anxiety: Secondary | ICD-10-CM | POA: Diagnosis not present

## 2024-01-24 DIAGNOSIS — I10 Essential (primary) hypertension: Secondary | ICD-10-CM | POA: Diagnosis not present

## 2024-01-26 DIAGNOSIS — M1A9XX0 Chronic gout, unspecified, without tophus (tophi): Secondary | ICD-10-CM | POA: Diagnosis not present

## 2024-01-26 DIAGNOSIS — M810 Age-related osteoporosis without current pathological fracture: Secondary | ICD-10-CM | POA: Diagnosis not present

## 2024-01-26 DIAGNOSIS — F411 Generalized anxiety disorder: Secondary | ICD-10-CM | POA: Diagnosis not present

## 2024-01-26 DIAGNOSIS — I10 Essential (primary) hypertension: Secondary | ICD-10-CM | POA: Diagnosis not present

## 2024-01-26 DIAGNOSIS — S81811D Laceration without foreign body, right lower leg, subsequent encounter: Secondary | ICD-10-CM | POA: Diagnosis not present

## 2024-01-26 DIAGNOSIS — F0394 Unspecified dementia, unspecified severity, with anxiety: Secondary | ICD-10-CM | POA: Diagnosis not present

## 2024-01-27 DIAGNOSIS — M1A9XX0 Chronic gout, unspecified, without tophus (tophi): Secondary | ICD-10-CM | POA: Diagnosis not present

## 2024-01-27 DIAGNOSIS — M810 Age-related osteoporosis without current pathological fracture: Secondary | ICD-10-CM | POA: Diagnosis not present

## 2024-01-27 DIAGNOSIS — I10 Essential (primary) hypertension: Secondary | ICD-10-CM | POA: Diagnosis not present

## 2024-01-27 DIAGNOSIS — F0394 Unspecified dementia, unspecified severity, with anxiety: Secondary | ICD-10-CM | POA: Diagnosis not present

## 2024-01-27 DIAGNOSIS — F411 Generalized anxiety disorder: Secondary | ICD-10-CM | POA: Diagnosis not present

## 2024-01-27 DIAGNOSIS — S81811D Laceration without foreign body, right lower leg, subsequent encounter: Secondary | ICD-10-CM | POA: Diagnosis not present

## 2024-01-31 DIAGNOSIS — M1A9XX0 Chronic gout, unspecified, without tophus (tophi): Secondary | ICD-10-CM | POA: Diagnosis not present

## 2024-01-31 DIAGNOSIS — F0394 Unspecified dementia, unspecified severity, with anxiety: Secondary | ICD-10-CM | POA: Diagnosis not present

## 2024-01-31 DIAGNOSIS — F411 Generalized anxiety disorder: Secondary | ICD-10-CM | POA: Diagnosis not present

## 2024-01-31 DIAGNOSIS — S81811D Laceration without foreign body, right lower leg, subsequent encounter: Secondary | ICD-10-CM | POA: Diagnosis not present

## 2024-01-31 DIAGNOSIS — M810 Age-related osteoporosis without current pathological fracture: Secondary | ICD-10-CM | POA: Diagnosis not present

## 2024-01-31 DIAGNOSIS — I10 Essential (primary) hypertension: Secondary | ICD-10-CM | POA: Diagnosis not present

## 2024-02-01 DIAGNOSIS — M810 Age-related osteoporosis without current pathological fracture: Secondary | ICD-10-CM | POA: Diagnosis not present

## 2024-02-01 DIAGNOSIS — S81811D Laceration without foreign body, right lower leg, subsequent encounter: Secondary | ICD-10-CM | POA: Diagnosis not present

## 2024-02-01 DIAGNOSIS — I10 Essential (primary) hypertension: Secondary | ICD-10-CM | POA: Diagnosis not present

## 2024-02-01 DIAGNOSIS — F0394 Unspecified dementia, unspecified severity, with anxiety: Secondary | ICD-10-CM | POA: Diagnosis not present

## 2024-02-01 DIAGNOSIS — M1A9XX0 Chronic gout, unspecified, without tophus (tophi): Secondary | ICD-10-CM | POA: Diagnosis not present

## 2024-02-01 DIAGNOSIS — F411 Generalized anxiety disorder: Secondary | ICD-10-CM | POA: Diagnosis not present

## 2024-02-02 DIAGNOSIS — M810 Age-related osteoporosis without current pathological fracture: Secondary | ICD-10-CM | POA: Diagnosis not present

## 2024-02-02 DIAGNOSIS — F411 Generalized anxiety disorder: Secondary | ICD-10-CM | POA: Diagnosis not present

## 2024-02-02 DIAGNOSIS — F0394 Unspecified dementia, unspecified severity, with anxiety: Secondary | ICD-10-CM | POA: Diagnosis not present

## 2024-02-02 DIAGNOSIS — I10 Essential (primary) hypertension: Secondary | ICD-10-CM | POA: Diagnosis not present

## 2024-02-02 DIAGNOSIS — S81811D Laceration without foreign body, right lower leg, subsequent encounter: Secondary | ICD-10-CM | POA: Diagnosis not present

## 2024-02-02 DIAGNOSIS — M1A9XX0 Chronic gout, unspecified, without tophus (tophi): Secondary | ICD-10-CM | POA: Diagnosis not present

## 2024-02-03 DIAGNOSIS — F0394 Unspecified dementia, unspecified severity, with anxiety: Secondary | ICD-10-CM | POA: Diagnosis not present

## 2024-02-03 DIAGNOSIS — M810 Age-related osteoporosis without current pathological fracture: Secondary | ICD-10-CM | POA: Diagnosis not present

## 2024-02-03 DIAGNOSIS — E559 Vitamin D deficiency, unspecified: Secondary | ICD-10-CM | POA: Diagnosis not present

## 2024-02-03 DIAGNOSIS — M1A9XX0 Chronic gout, unspecified, without tophus (tophi): Secondary | ICD-10-CM | POA: Diagnosis not present

## 2024-02-03 DIAGNOSIS — F411 Generalized anxiety disorder: Secondary | ICD-10-CM | POA: Diagnosis not present

## 2024-02-03 DIAGNOSIS — S81811D Laceration without foreign body, right lower leg, subsequent encounter: Secondary | ICD-10-CM | POA: Diagnosis not present

## 2024-02-03 DIAGNOSIS — I10 Essential (primary) hypertension: Secondary | ICD-10-CM | POA: Diagnosis not present

## 2024-02-04 DIAGNOSIS — I10 Essential (primary) hypertension: Secondary | ICD-10-CM | POA: Diagnosis not present

## 2024-02-04 DIAGNOSIS — E559 Vitamin D deficiency, unspecified: Secondary | ICD-10-CM | POA: Diagnosis not present

## 2024-02-04 DIAGNOSIS — K219 Gastro-esophageal reflux disease without esophagitis: Secondary | ICD-10-CM | POA: Diagnosis not present

## 2024-02-04 DIAGNOSIS — F0394 Unspecified dementia, unspecified severity, with anxiety: Secondary | ICD-10-CM | POA: Diagnosis not present

## 2024-02-04 DIAGNOSIS — F411 Generalized anxiety disorder: Secondary | ICD-10-CM | POA: Diagnosis not present

## 2024-02-04 DIAGNOSIS — W19XXXD Unspecified fall, subsequent encounter: Secondary | ICD-10-CM | POA: Diagnosis not present

## 2024-02-04 DIAGNOSIS — M1A9XX0 Chronic gout, unspecified, without tophus (tophi): Secondary | ICD-10-CM | POA: Diagnosis not present

## 2024-02-04 DIAGNOSIS — Z853 Personal history of malignant neoplasm of breast: Secondary | ICD-10-CM | POA: Diagnosis not present

## 2024-02-04 DIAGNOSIS — M810 Age-related osteoporosis without current pathological fracture: Secondary | ICD-10-CM | POA: Diagnosis not present

## 2024-02-04 DIAGNOSIS — K589 Irritable bowel syndrome without diarrhea: Secondary | ICD-10-CM | POA: Diagnosis not present

## 2024-02-04 DIAGNOSIS — S81811D Laceration without foreign body, right lower leg, subsequent encounter: Secondary | ICD-10-CM | POA: Diagnosis not present

## 2024-02-07 DIAGNOSIS — M810 Age-related osteoporosis without current pathological fracture: Secondary | ICD-10-CM | POA: Diagnosis not present

## 2024-02-07 DIAGNOSIS — M1A9XX0 Chronic gout, unspecified, without tophus (tophi): Secondary | ICD-10-CM | POA: Diagnosis not present

## 2024-02-07 DIAGNOSIS — I10 Essential (primary) hypertension: Secondary | ICD-10-CM | POA: Diagnosis not present

## 2024-02-07 DIAGNOSIS — F411 Generalized anxiety disorder: Secondary | ICD-10-CM | POA: Diagnosis not present

## 2024-02-07 DIAGNOSIS — S81811D Laceration without foreign body, right lower leg, subsequent encounter: Secondary | ICD-10-CM | POA: Diagnosis not present

## 2024-02-07 DIAGNOSIS — F0394 Unspecified dementia, unspecified severity, with anxiety: Secondary | ICD-10-CM | POA: Diagnosis not present

## 2024-02-09 DIAGNOSIS — M1A9XX0 Chronic gout, unspecified, without tophus (tophi): Secondary | ICD-10-CM | POA: Diagnosis not present

## 2024-02-09 DIAGNOSIS — S81811D Laceration without foreign body, right lower leg, subsequent encounter: Secondary | ICD-10-CM | POA: Diagnosis not present

## 2024-02-09 DIAGNOSIS — I10 Essential (primary) hypertension: Secondary | ICD-10-CM | POA: Diagnosis not present

## 2024-02-09 DIAGNOSIS — F411 Generalized anxiety disorder: Secondary | ICD-10-CM | POA: Diagnosis not present

## 2024-02-09 DIAGNOSIS — M810 Age-related osteoporosis without current pathological fracture: Secondary | ICD-10-CM | POA: Diagnosis not present

## 2024-02-09 DIAGNOSIS — F0394 Unspecified dementia, unspecified severity, with anxiety: Secondary | ICD-10-CM | POA: Diagnosis not present

## 2024-02-10 DIAGNOSIS — F0394 Unspecified dementia, unspecified severity, with anxiety: Secondary | ICD-10-CM | POA: Diagnosis not present

## 2024-02-10 DIAGNOSIS — F411 Generalized anxiety disorder: Secondary | ICD-10-CM | POA: Diagnosis not present

## 2024-02-10 DIAGNOSIS — I10 Essential (primary) hypertension: Secondary | ICD-10-CM | POA: Diagnosis not present

## 2024-02-10 DIAGNOSIS — M1A9XX0 Chronic gout, unspecified, without tophus (tophi): Secondary | ICD-10-CM | POA: Diagnosis not present

## 2024-02-10 DIAGNOSIS — M810 Age-related osteoporosis without current pathological fracture: Secondary | ICD-10-CM | POA: Diagnosis not present

## 2024-02-10 DIAGNOSIS — Z79899 Other long term (current) drug therapy: Secondary | ICD-10-CM | POA: Diagnosis not present

## 2024-02-10 DIAGNOSIS — S81811D Laceration without foreign body, right lower leg, subsequent encounter: Secondary | ICD-10-CM | POA: Diagnosis not present

## 2024-02-15 DIAGNOSIS — I10 Essential (primary) hypertension: Secondary | ICD-10-CM | POA: Diagnosis not present

## 2024-02-15 DIAGNOSIS — S81811D Laceration without foreign body, right lower leg, subsequent encounter: Secondary | ICD-10-CM | POA: Diagnosis not present

## 2024-02-15 DIAGNOSIS — F411 Generalized anxiety disorder: Secondary | ICD-10-CM | POA: Diagnosis not present

## 2024-02-15 DIAGNOSIS — M1A9XX0 Chronic gout, unspecified, without tophus (tophi): Secondary | ICD-10-CM | POA: Diagnosis not present

## 2024-02-15 DIAGNOSIS — M810 Age-related osteoporosis without current pathological fracture: Secondary | ICD-10-CM | POA: Diagnosis not present

## 2024-02-15 DIAGNOSIS — F0394 Unspecified dementia, unspecified severity, with anxiety: Secondary | ICD-10-CM | POA: Diagnosis not present

## 2024-02-16 DIAGNOSIS — F0394 Unspecified dementia, unspecified severity, with anxiety: Secondary | ICD-10-CM | POA: Diagnosis not present

## 2024-02-16 DIAGNOSIS — F411 Generalized anxiety disorder: Secondary | ICD-10-CM | POA: Diagnosis not present

## 2024-02-16 DIAGNOSIS — E119 Type 2 diabetes mellitus without complications: Secondary | ICD-10-CM | POA: Diagnosis not present

## 2024-02-16 DIAGNOSIS — M810 Age-related osteoporosis without current pathological fracture: Secondary | ICD-10-CM | POA: Diagnosis not present

## 2024-02-16 DIAGNOSIS — S81811D Laceration without foreign body, right lower leg, subsequent encounter: Secondary | ICD-10-CM | POA: Diagnosis not present

## 2024-02-16 DIAGNOSIS — I10 Essential (primary) hypertension: Secondary | ICD-10-CM | POA: Diagnosis not present

## 2024-02-16 DIAGNOSIS — E782 Mixed hyperlipidemia: Secondary | ICD-10-CM | POA: Diagnosis not present

## 2024-02-16 DIAGNOSIS — D519 Vitamin B12 deficiency anemia, unspecified: Secondary | ICD-10-CM | POA: Diagnosis not present

## 2024-02-16 DIAGNOSIS — E038 Other specified hypothyroidism: Secondary | ICD-10-CM | POA: Diagnosis not present

## 2024-02-16 DIAGNOSIS — M1A9XX0 Chronic gout, unspecified, without tophus (tophi): Secondary | ICD-10-CM | POA: Diagnosis not present

## 2024-02-17 DIAGNOSIS — F0394 Unspecified dementia, unspecified severity, with anxiety: Secondary | ICD-10-CM | POA: Diagnosis not present

## 2024-02-17 DIAGNOSIS — I10 Essential (primary) hypertension: Secondary | ICD-10-CM | POA: Diagnosis not present

## 2024-02-17 DIAGNOSIS — M810 Age-related osteoporosis without current pathological fracture: Secondary | ICD-10-CM | POA: Diagnosis not present

## 2024-02-17 DIAGNOSIS — S81811D Laceration without foreign body, right lower leg, subsequent encounter: Secondary | ICD-10-CM | POA: Diagnosis not present

## 2024-02-17 DIAGNOSIS — M1A9XX0 Chronic gout, unspecified, without tophus (tophi): Secondary | ICD-10-CM | POA: Diagnosis not present

## 2024-02-17 DIAGNOSIS — F411 Generalized anxiety disorder: Secondary | ICD-10-CM | POA: Diagnosis not present

## 2024-02-20 DIAGNOSIS — R4182 Altered mental status, unspecified: Secondary | ICD-10-CM | POA: Diagnosis not present

## 2024-02-21 DIAGNOSIS — S81811D Laceration without foreign body, right lower leg, subsequent encounter: Secondary | ICD-10-CM | POA: Diagnosis not present

## 2024-02-21 DIAGNOSIS — F0394 Unspecified dementia, unspecified severity, with anxiety: Secondary | ICD-10-CM | POA: Diagnosis not present

## 2024-02-21 DIAGNOSIS — M810 Age-related osteoporosis without current pathological fracture: Secondary | ICD-10-CM | POA: Diagnosis not present

## 2024-02-21 DIAGNOSIS — M1A9XX0 Chronic gout, unspecified, without tophus (tophi): Secondary | ICD-10-CM | POA: Diagnosis not present

## 2024-02-21 DIAGNOSIS — F411 Generalized anxiety disorder: Secondary | ICD-10-CM | POA: Diagnosis not present

## 2024-02-21 DIAGNOSIS — I10 Essential (primary) hypertension: Secondary | ICD-10-CM | POA: Diagnosis not present

## 2024-02-24 DIAGNOSIS — F0394 Unspecified dementia, unspecified severity, with anxiety: Secondary | ICD-10-CM | POA: Diagnosis not present

## 2024-02-24 DIAGNOSIS — M810 Age-related osteoporosis without current pathological fracture: Secondary | ICD-10-CM | POA: Diagnosis not present

## 2024-02-24 DIAGNOSIS — M1A9XX0 Chronic gout, unspecified, without tophus (tophi): Secondary | ICD-10-CM | POA: Diagnosis not present

## 2024-02-24 DIAGNOSIS — I10 Essential (primary) hypertension: Secondary | ICD-10-CM | POA: Diagnosis not present

## 2024-02-24 DIAGNOSIS — S81811D Laceration without foreign body, right lower leg, subsequent encounter: Secondary | ICD-10-CM | POA: Diagnosis not present

## 2024-02-24 DIAGNOSIS — F411 Generalized anxiety disorder: Secondary | ICD-10-CM | POA: Diagnosis not present

## 2024-02-26 DIAGNOSIS — E559 Vitamin D deficiency, unspecified: Secondary | ICD-10-CM | POA: Diagnosis not present

## 2024-02-26 DIAGNOSIS — M810 Age-related osteoporosis without current pathological fracture: Secondary | ICD-10-CM | POA: Diagnosis not present

## 2024-02-26 DIAGNOSIS — I10 Essential (primary) hypertension: Secondary | ICD-10-CM | POA: Diagnosis not present

## 2024-02-28 DIAGNOSIS — F411 Generalized anxiety disorder: Secondary | ICD-10-CM | POA: Diagnosis not present

## 2024-02-28 DIAGNOSIS — S81811D Laceration without foreign body, right lower leg, subsequent encounter: Secondary | ICD-10-CM | POA: Diagnosis not present

## 2024-02-28 DIAGNOSIS — M1A9XX0 Chronic gout, unspecified, without tophus (tophi): Secondary | ICD-10-CM | POA: Diagnosis not present

## 2024-02-28 DIAGNOSIS — F0394 Unspecified dementia, unspecified severity, with anxiety: Secondary | ICD-10-CM | POA: Diagnosis not present

## 2024-02-28 DIAGNOSIS — M810 Age-related osteoporosis without current pathological fracture: Secondary | ICD-10-CM | POA: Diagnosis not present

## 2024-02-28 DIAGNOSIS — I10 Essential (primary) hypertension: Secondary | ICD-10-CM | POA: Diagnosis not present

## 2024-03-02 DIAGNOSIS — M1A9XX0 Chronic gout, unspecified, without tophus (tophi): Secondary | ICD-10-CM | POA: Diagnosis not present

## 2024-03-02 DIAGNOSIS — S81811D Laceration without foreign body, right lower leg, subsequent encounter: Secondary | ICD-10-CM | POA: Diagnosis not present

## 2024-03-02 DIAGNOSIS — I10 Essential (primary) hypertension: Secondary | ICD-10-CM | POA: Diagnosis not present

## 2024-03-02 DIAGNOSIS — M810 Age-related osteoporosis without current pathological fracture: Secondary | ICD-10-CM | POA: Diagnosis not present

## 2024-03-02 DIAGNOSIS — F411 Generalized anxiety disorder: Secondary | ICD-10-CM | POA: Diagnosis not present

## 2024-03-02 DIAGNOSIS — F0394 Unspecified dementia, unspecified severity, with anxiety: Secondary | ICD-10-CM | POA: Diagnosis not present

## 2024-03-05 DIAGNOSIS — F0394 Unspecified dementia, unspecified severity, with anxiety: Secondary | ICD-10-CM | POA: Diagnosis not present

## 2024-03-05 DIAGNOSIS — F411 Generalized anxiety disorder: Secondary | ICD-10-CM | POA: Diagnosis not present

## 2024-03-05 DIAGNOSIS — K589 Irritable bowel syndrome without diarrhea: Secondary | ICD-10-CM | POA: Diagnosis not present

## 2024-03-05 DIAGNOSIS — W19XXXD Unspecified fall, subsequent encounter: Secondary | ICD-10-CM | POA: Diagnosis not present

## 2024-03-05 DIAGNOSIS — M810 Age-related osteoporosis without current pathological fracture: Secondary | ICD-10-CM | POA: Diagnosis not present

## 2024-03-05 DIAGNOSIS — M1A9XX0 Chronic gout, unspecified, without tophus (tophi): Secondary | ICD-10-CM | POA: Diagnosis not present

## 2024-03-05 DIAGNOSIS — I10 Essential (primary) hypertension: Secondary | ICD-10-CM | POA: Diagnosis not present

## 2024-03-05 DIAGNOSIS — Z853 Personal history of malignant neoplasm of breast: Secondary | ICD-10-CM | POA: Diagnosis not present

## 2024-03-05 DIAGNOSIS — E559 Vitamin D deficiency, unspecified: Secondary | ICD-10-CM | POA: Diagnosis not present

## 2024-03-05 DIAGNOSIS — S81811D Laceration without foreign body, right lower leg, subsequent encounter: Secondary | ICD-10-CM | POA: Diagnosis not present

## 2024-03-05 DIAGNOSIS — K219 Gastro-esophageal reflux disease without esophagitis: Secondary | ICD-10-CM | POA: Diagnosis not present

## 2024-03-08 DIAGNOSIS — M810 Age-related osteoporosis without current pathological fracture: Secondary | ICD-10-CM | POA: Diagnosis not present

## 2024-03-08 DIAGNOSIS — F0394 Unspecified dementia, unspecified severity, with anxiety: Secondary | ICD-10-CM | POA: Diagnosis not present

## 2024-03-08 DIAGNOSIS — M1A9XX0 Chronic gout, unspecified, without tophus (tophi): Secondary | ICD-10-CM | POA: Diagnosis not present

## 2024-03-08 DIAGNOSIS — S81811D Laceration without foreign body, right lower leg, subsequent encounter: Secondary | ICD-10-CM | POA: Diagnosis not present

## 2024-03-08 DIAGNOSIS — F411 Generalized anxiety disorder: Secondary | ICD-10-CM | POA: Diagnosis not present

## 2024-03-08 DIAGNOSIS — I10 Essential (primary) hypertension: Secondary | ICD-10-CM | POA: Diagnosis not present

## 2024-03-09 DIAGNOSIS — K589 Irritable bowel syndrome without diarrhea: Secondary | ICD-10-CM | POA: Diagnosis not present

## 2024-03-09 DIAGNOSIS — I10 Essential (primary) hypertension: Secondary | ICD-10-CM | POA: Diagnosis not present

## 2024-03-09 DIAGNOSIS — M810 Age-related osteoporosis without current pathological fracture: Secondary | ICD-10-CM | POA: Diagnosis not present

## 2024-03-10 DIAGNOSIS — I10 Essential (primary) hypertension: Secondary | ICD-10-CM | POA: Diagnosis not present

## 2024-03-10 DIAGNOSIS — S81811D Laceration without foreign body, right lower leg, subsequent encounter: Secondary | ICD-10-CM | POA: Diagnosis not present

## 2024-03-10 DIAGNOSIS — F0394 Unspecified dementia, unspecified severity, with anxiety: Secondary | ICD-10-CM | POA: Diagnosis not present

## 2024-03-10 DIAGNOSIS — F411 Generalized anxiety disorder: Secondary | ICD-10-CM | POA: Diagnosis not present

## 2024-03-13 DIAGNOSIS — F0394 Unspecified dementia, unspecified severity, with anxiety: Secondary | ICD-10-CM | POA: Diagnosis not present

## 2024-03-13 DIAGNOSIS — M1A9XX0 Chronic gout, unspecified, without tophus (tophi): Secondary | ICD-10-CM | POA: Diagnosis not present

## 2024-03-13 DIAGNOSIS — S81811D Laceration without foreign body, right lower leg, subsequent encounter: Secondary | ICD-10-CM | POA: Diagnosis not present

## 2024-03-13 DIAGNOSIS — M810 Age-related osteoporosis without current pathological fracture: Secondary | ICD-10-CM | POA: Diagnosis not present

## 2024-03-13 DIAGNOSIS — F411 Generalized anxiety disorder: Secondary | ICD-10-CM | POA: Diagnosis not present

## 2024-03-13 DIAGNOSIS — I10 Essential (primary) hypertension: Secondary | ICD-10-CM | POA: Diagnosis not present

## 2024-03-14 DIAGNOSIS — D519 Vitamin B12 deficiency anemia, unspecified: Secondary | ICD-10-CM | POA: Diagnosis not present

## 2024-03-14 DIAGNOSIS — R7309 Other abnormal glucose: Secondary | ICD-10-CM | POA: Diagnosis not present

## 2024-03-14 DIAGNOSIS — E038 Other specified hypothyroidism: Secondary | ICD-10-CM | POA: Diagnosis not present

## 2024-03-14 DIAGNOSIS — E782 Mixed hyperlipidemia: Secondary | ICD-10-CM | POA: Diagnosis not present

## 2024-03-14 DIAGNOSIS — Z79899 Other long term (current) drug therapy: Secondary | ICD-10-CM | POA: Diagnosis not present

## 2024-03-22 DIAGNOSIS — D519 Vitamin B12 deficiency anemia, unspecified: Secondary | ICD-10-CM | POA: Diagnosis not present

## 2024-03-22 DIAGNOSIS — E782 Mixed hyperlipidemia: Secondary | ICD-10-CM | POA: Diagnosis not present

## 2024-03-22 DIAGNOSIS — E038 Other specified hypothyroidism: Secondary | ICD-10-CM | POA: Diagnosis not present

## 2024-03-22 DIAGNOSIS — E119 Type 2 diabetes mellitus without complications: Secondary | ICD-10-CM | POA: Diagnosis not present

## 2024-03-23 DIAGNOSIS — M810 Age-related osteoporosis without current pathological fracture: Secondary | ICD-10-CM | POA: Diagnosis not present

## 2024-03-23 DIAGNOSIS — F411 Generalized anxiety disorder: Secondary | ICD-10-CM | POA: Diagnosis not present

## 2024-03-23 DIAGNOSIS — M1A9XX0 Chronic gout, unspecified, without tophus (tophi): Secondary | ICD-10-CM | POA: Diagnosis not present

## 2024-03-23 DIAGNOSIS — I10 Essential (primary) hypertension: Secondary | ICD-10-CM | POA: Diagnosis not present

## 2024-03-23 DIAGNOSIS — F0394 Unspecified dementia, unspecified severity, with anxiety: Secondary | ICD-10-CM | POA: Diagnosis not present

## 2024-03-23 DIAGNOSIS — S81811D Laceration without foreign body, right lower leg, subsequent encounter: Secondary | ICD-10-CM | POA: Diagnosis not present

## 2024-04-06 ENCOUNTER — Encounter: Payer: Self-pay | Admitting: Neurology

## 2024-04-06 ENCOUNTER — Ambulatory Visit: Payer: Medicare Other | Admitting: Neurology

## 2024-04-06 VITALS — BP 133/80 | HR 66 | Ht 59.0 in | Wt 121.5 lb

## 2024-04-06 DIAGNOSIS — F02A4 Dementia in other diseases classified elsewhere, mild, with anxiety: Secondary | ICD-10-CM | POA: Diagnosis not present

## 2024-04-06 DIAGNOSIS — F419 Anxiety disorder, unspecified: Secondary | ICD-10-CM

## 2024-04-06 DIAGNOSIS — R55 Syncope and collapse: Secondary | ICD-10-CM | POA: Diagnosis not present

## 2024-04-06 DIAGNOSIS — G301 Alzheimer's disease with late onset: Secondary | ICD-10-CM

## 2024-04-06 NOTE — Progress Notes (Unsigned)
 [facility]  PATIENT: Nancy Webb DOB: August 17, 1931  REQUESTING CLINICIAN: [name], DO HISTORY FROM: Patient but mainly daughter and son  REASON FOR VISIT: Memory loss    HISTORICAL  CHIEF COMPLAINT:  Chief Complaint  Patient presents with   Follow-up    Pt in 23 with son and daughter Pt here for memory f/u Son states memory is better Pt resides in [facility]     INTERVAL HISTORY 04/06/2024 Discussed the use of AI scribe software for clinical note transcription with the patient, who gave verbal consent to proceed.  Nancy Webb is a 89 year old female who presents history of dementia who present for follow up for memory and  episodes of syncope and medication management concerns.  For her dementia, family reports that her memory is stable and even improving since moving to [facility] assisted living. They provide her medications and meal, however she does participate minimally in physical activity.   She has experienced three episodes of syncope over the past five years. The first episode occurred four years ago at a friend's house, which was very warm, leading to unresponsiveness for a few minutes before regaining alertness. No issues were found during the evaluation at that time. The second episode occurred in July of this year after a fall at midnight, resulting in 19 stitches. In the emergency room, she had a similar episode of unresponsiveness while sitting on a bench. A CT scan and blood work showed no abnormalities. During her hospitalization, she was diagnosed with symptomatic bradycardia and her Aricept  was discontinued.  The third episode happened recently, with a brief period of unresponsiveness before regaining consciousness prior to EMS arrival. She was off Aricept . Each episode was followed by increased alertness. While being off Aricept , family has noted worsening in memory, therefore after the third fall, Aricept  was reintroduce with  improvement of memory.   She resides at [facility] assisted [facility] facility, where she has been for the past year. Her daughter notes that the social interaction has been beneficial, and she has been more active since moving there, as her room is further from the dining area, requiring more walking. She participates minimally in physical activities and prefers to sit around, though her mobility has improved since the move.  Her medication regimen includes Aricept  5 mg, which was temporarily stopped due to concerns about her heart rate but has since been resumed. During the period off Aricept , her daughter noticed a decline in her condition, which improved once the medication was restarted. She is also on Buspirone for anxiety, which replaced Celexa .    HISTORY OF PRESENT ILLNESS:  This is a 89 year old woman with past medical history including hypertension, hyponatremia, GERD, memory loss, chronic alcohol use who is presenting with her daughter and son for management of memory.  In brief they tell me patient used to live independently by herself, driving her car as of March 2024. In April, they noted that she had difficulty using her remote TV, and also it was reported that she was not taking her medications as instructed.   She used to do puzzles but she stopped doing the puzzle in April, she has difficulty following her favorite TV shows.  Family then started noticing the patient has period of confusion, difficulty using items around the house and worsening memory loss. Daughter tells me that it seems like patient has difficulty understanding how to do basic things.  When trying to get in a car, she will standby  the door and ask her daughter how do you get in the car.  She can also take her sunglasses, and ask her daughter what should I do with this, how to put it on and she will go ahead and put it on without help.  Family has noted more difficulty and as of September they had taken her to [facility]  assisted living facility.  She still drinks alcohol, prior to going to [facility], she was drinking 3-5 beers or wine cooler per day but since being at [facility], she only drinks 5 ounces of wine every night.   TBI:  No past history of TBI Stroke:  no past history of stroke Seizures:  no past history of seizures Sleep:  no history of sleep apnea.  Mood:  patient denies anxiety and depression Family history of Dementia:  Denies  Functional status:  Patient lives in an assited living facility . Cooking: warming soup Cleaning: no cleaning  Shopping: not shopping  Bathing: needs help  Toileting: needs help  Driving: Not driving since March Bills: Not paying the bills since March  Medications: Needs helps with her medications  Ever left the stove on by accident?: No Forget how to use items around the house?: Yes  Getting lost going to familiar places?: Yes  Forgetting loved ones names?: No issues  Word finding difficulty? Denies  Sleep: Not sleeping well    OTHER MEDICAL CONDITIONS: Hypertension, GERD, Glaucoma, Chronic alcohol use    REVIEW OF SYSTEMS: Full 14 system review of systems performed and negative with exception of: As noted in the HPI   ALLERGIES: Allergies  Allergen Reactions   Loratadine Other (See Comments)    REACTION: headache REACTION: headache     HOME MEDICATIONS: Outpatient Medications Prior to Visit  Medication Sig Dispense Refill   amLODipine (NORVASC) 5 MG tablet Take 5 mg by mouth daily.      Cholecalciferol 125 MCG (5000 UT) capsule cholecalciferol (vitamin D3) 125 mcg (5,000 unit) capsule     donepezil  (ARICEPT ) 5 MG tablet Take 1 tablet (5 mg total) by mouth at bedtime. 30 tablet 6   doxycycline  (VIBRA -TABS) 100 MG tablet Take 1 tablet (100 mg total) by mouth 2 (two) times daily. 14 tablet 0   Ensure (ENSURE) Take 237 mLs by mouth daily.     esomeprazole (NEXIUM) 40 MG capsule Take 40 mg by mouth daily at 12 noon.     hyoscyamine  (LEVSIN  SL)  0.125 MG SL tablet Place 1 tablet (0.125 mg total) under the tongue 4 (four) times daily as needed. 100 tablet 11   irbesartan (AVAPRO) 300 MG tablet Take 300 mg by mouth daily.      montelukast (SINGULAIR) 10 MG tablet Take 1 tablet by mouth daily.     Netarsudil-Latanoprost (ROCKLATAN) 0.02-0.005 % SOLN Apply 1 drop to eye at bedtime.     ondansetron (ZOFRAN) 8 MG tablet Take by mouth every 8 (eight) hours as needed for nausea or vomiting.     timolol (BETIMOL) 0.5 % ophthalmic solution 1 drop daily.     citalopram  (CELEXA ) 20 MG tablet Take 1 tablet (20 mg total) by mouth daily. 30 tablet 6   No facility-administered medications prior to visit.    PAST MEDICAL HISTORY: Past Medical History:  Diagnosis Date   Adenomatous colon polyp 02/1993   Allergic rhinitis    Arthritis    Asthma    Breast cancer (HCC)    Right breast   Diverticulosis    GERD (gastroesophageal  reflux disease)    Gout    Hiatal hernia    Hyperlipidemia    Hypertension    IBS (irritable bowel syndrome)    Internal hemorrhoids    Varicose veins    superificial thrombophlebitis right leg  09-2013    PAST SURGICAL HISTORY: Past Surgical History:  Procedure Laterality Date   CATARACT EXTRACTION, BILATERAL  08/2012, 09/2012   CYSTECTOMY Right    ovary   ENDOVENOUS ABLATION SAPHENOUS VEIN W/ LASER Right 07/21/2018   endovenous laser ablation right greater saphenous vein by Lonni Blade MD    MASTECTOMY Right 1987    FAMILY HISTORY: Family History  Problem Relation Age of Onset   Alzheimer's disease Neg Hx    Dementia Neg Hx     SOCIAL HISTORY: Social History   Socioeconomic History   Marital status: Widowed    Spouse name: Not on file   Number of children: 2   Years of education: Not on file   Highest education level: Not on file  Occupational History   Occupation: Accounting retired  Tobacco Use   Smoking status: Never   Smokeless tobacco: Never  Vaping Use   Vaping status: Never  Used  Substance and Sexual Activity   Alcohol use: Yes    Comment: social   Drug use: No   Sexual activity: Not on file  Other Topics Concern   Not on file  Social History Narrative   Lives in ALF    Retired    Social Drivers of Corporate investment banker Strain: Not on file  Food Insecurity: Low Risk  (12/28/2023)   Received from [facility]   Hunger Vital Sign    Within the past 12 months, you worried that your food would run out before you got money to buy more: Never true    Within the past 12 months, the food you bought just didn't last and you didn't have money to get more. : Never true  Transportation Needs: No Transportation Needs (12/28/2023)   Received from [facility]    In the past 12 months, has lack of reliable transportation kept you from medical appointments, meetings, work or from getting things needed for daily living? : No  Physical Activity: Not on file  Stress: Not on file  Social Connections: Not on file  Intimate Partner Violence: Not on file    PHYSICAL EXAM  GENERAL EXAM/CONSTITUTIONAL: Vitals:  Vitals:   04/06/24 1342  BP: 133/80  Pulse: 66  Weight: 121 lb 8 oz (55.1 kg)  Height: 4' 11 (1.499 m)   Body mass index is 24.54 kg/m. Wt Readings from Last 3 Encounters:  04/06/24 121 lb 8 oz (55.1 kg)  01/21/24 143 lb (64.9 kg)  08/11/18 145 lb (65.8 kg)   Patient is in no distress; well developed, nourished and groomed; neck is supple  MUSCULOSKELETAL: Gait, strength, tone, movements noted in Neurologic exam below  NEUROLOGIC: MENTAL STATUS:     04/06/2024    1:45 PM 04/08/2023    3:22 PM  MMSE - Mini Mental State Exam  Orientation to time 3 3  Orientation to Place 4 3  Registration 3 2  Attention/ Calculation 2 0  Recall 1 0  Language- name 2 objects 2 2  Language- repeat 1 0  Language- follow 3 step command 3 3  Language- read & follow direction 1 1  Write a sentence 0 1  Copy design 0 0  Total score 20  15   CRANIAL NERVE:  2nd, 3rd, 4th, 6th- visual fields full to confrontation, extraocular muscles intact, no nystagmus 5th - facial sensation symmetric 7th - facial strength symmetric 8th - hearing intact 9th - palate elevates symmetrically, uvula midline 11th - shoulder shrug symmetric 12th - tongue protrusion midline  MOTOR:  normal bulk and tone, full strength in the BUE, BLE  SENSORY:  normal and symmetric to light touch  COORDINATION:  finger-nose-finger, fine finger movements normal  GAIT/STATION:  With a rolling walker     DIAGNOSTIC DATA (LABS, IMAGING, TESTING) - I reviewed patient records, labs, notes, testing and imaging myself where available.  No results found for: WBC, HGB, HCT, MCV, PLT No results found for: NA, K, CL, CO2, GLUCOSE, BUN, CREATININE, CALCIUM, PROT, ALBUMIN, AST, ALT, ALKPHOS, BILITOT, GFRNONAA, GFRAA No results found for: CHOL, HDL, LDLCALC, LDLDIRECT, TRIG, CHOLHDL No results found for: YHAJ8R No results found for: VITAMINB12 No results found for: TSH  MRI Brain 01/02/2023 1. No acute intracranial abnormality. 2. Mild generalized atrophy and white matter disease likely reflects the sequela of chronic microvascular ischemia. 3. Enlargement of the lateral ventricles, particularly within the atria likely reflects volume loss rather than hydrocephalus. 4. No evidence for metastatic disease to the brain.    ASSESSMENT AND PLAN  89 y.o. year old female with history including hypertension, hyponatremia, GERD, memory loss, chronic alcohol use who is presenting with son and daughter for follow up for her dementia.    Mild Alzheimer's disease with late onset Mild Alzheimer's disease with late onset, managed with Aricept  5 mg. Cognitive function improved, with test scores increasing from 15 to 20. Previous temporary discontinuation of Aricept  due to low heart rate, now stable at 66 bpm.  Continued benefit from Aricept  observed, with improved cognitive function and stabilization of symptoms. - Continue Aricept  5 mg daily. - Monitor cognitive function and heart rate.  Recurrent unexplained syncope Three episodes of unexplained syncope over the past five years, characterized by sudden loss of consciousness while sitting, followed by rapid recovery and increased alertness. Differential diagnosis includes vasovagal syncope, cardiac arrhythmias, or seizures. Previous CT scans and blood work were unremarkable. Increased alertness post-episode is atypical for seizures, warranting an EEG. Syncope while sitting suggests potential cardiac issues, necessitating a 30-day cardiac monitor to assess for arrhythmias. - Order EEG to evaluate for potential seizures. - Arrange for a 30-day cardiac monitor to assess for arrhythmias.  Anxiety disorder Anxiety disorder managed with Buspirone. Previous treatment with Celexa  was discontinued, unclear reason (stopped because of no refill or provider choice). Improvement noted with Buspirone, but clarification on Celexa  status is needed. - Continue with Buspirone. - Discuss with facility staff regarding the status of Celexa  and ensure proper medication management.    1. Mild late onset Alzheimer's dementia with anxiety (HCC)   2. Syncope, unspecified syncope type   3. Anxiety       Patient Instructions  Continue current medications  EEG to rule out seizure  Will obtain a cardiac monitor  Continue to follow up with your doctor Return in a year or sooner if worse  Orders Placed This Encounter  Procedures   CARDIAC EVENT MONITOR   EEG adult    No orders of the defined types were placed in this encounter.   Return in about 1 year (around 04/06/2025).    Pastor Falling, MD 04/07/2024, 8:30 AM  [facility] [address], [address] [place], [place] [address] [phone]

## 2024-04-06 NOTE — Patient Instructions (Signed)
 Continue current medications  EEG to rule out seizure  Will obtain a cardiac monitor  Continue to follow up with your doctor Return in a year or sooner if worse

## 2024-04-07 DIAGNOSIS — H04123 Dry eye syndrome of bilateral lacrimal glands: Secondary | ICD-10-CM | POA: Diagnosis not present

## 2024-04-07 DIAGNOSIS — H26492 Other secondary cataract, left eye: Secondary | ICD-10-CM | POA: Diagnosis not present

## 2024-04-07 DIAGNOSIS — H401411 Capsular glaucoma with pseudoexfoliation of lens, right eye, mild stage: Secondary | ICD-10-CM | POA: Diagnosis not present

## 2024-04-07 DIAGNOSIS — H40022 Open angle with borderline findings, high risk, left eye: Secondary | ICD-10-CM | POA: Diagnosis not present

## 2024-04-20 DIAGNOSIS — E782 Mixed hyperlipidemia: Secondary | ICD-10-CM | POA: Diagnosis not present

## 2024-04-20 DIAGNOSIS — E119 Type 2 diabetes mellitus without complications: Secondary | ICD-10-CM | POA: Diagnosis not present

## 2024-04-20 DIAGNOSIS — D519 Vitamin B12 deficiency anemia, unspecified: Secondary | ICD-10-CM | POA: Diagnosis not present

## 2024-04-20 DIAGNOSIS — E038 Other specified hypothyroidism: Secondary | ICD-10-CM | POA: Diagnosis not present

## 2024-04-21 DIAGNOSIS — Z1231 Encounter for screening mammogram for malignant neoplasm of breast: Secondary | ICD-10-CM | POA: Diagnosis not present

## 2024-04-28 ENCOUNTER — Ambulatory Visit (INDEPENDENT_AMBULATORY_CARE_PROVIDER_SITE_OTHER): Admitting: Neurology

## 2024-04-28 ENCOUNTER — Ambulatory Visit: Admitting: Podiatry

## 2024-04-28 ENCOUNTER — Other Ambulatory Visit: Payer: Self-pay | Admitting: Neurology

## 2024-04-28 ENCOUNTER — Encounter: Payer: Self-pay | Admitting: Podiatry

## 2024-04-28 DIAGNOSIS — R4182 Altered mental status, unspecified: Secondary | ICD-10-CM | POA: Diagnosis not present

## 2024-04-28 DIAGNOSIS — M79674 Pain in right toe(s): Secondary | ICD-10-CM

## 2024-04-28 DIAGNOSIS — F02A4 Dementia in other diseases classified elsewhere, mild, with anxiety: Secondary | ICD-10-CM

## 2024-04-28 DIAGNOSIS — B351 Tinea unguium: Secondary | ICD-10-CM

## 2024-04-28 DIAGNOSIS — M79675 Pain in left toe(s): Secondary | ICD-10-CM | POA: Diagnosis not present

## 2024-04-28 MED ORDER — CITALOPRAM HYDROBROMIDE 20 MG PO TABS: 20.0000 mg | ORAL_TABLET | Freq: Every day | ORAL | 3 refills | Status: AC

## 2024-04-28 NOTE — Procedures (Signed)
    History:  89 year old woman with dementia   EEG classification: Awake and drowsy  Duration: 26 minutes   Technical aspects: This EEG study was done with scalp electrodes positioned according to the 10-20 International system of electrode placement. Electrical activity was reviewed with band pass filter of 1-70Hz , sensitivity of 7 uV/mm, display speed of 50mm/sec with a 60Hz  notched filter applied as appropriate. EEG data were recorded continuously and digitally stored.   Description of the recording: The background rhythms of this recording consists of a fairly well modulated medium amplitude theta rhythm of 7 Hz. Photic stimulation was performed, did not show any abnormalities. Hyperventilation was not performed. Drowsiness was manifested by background fragmentation. No abnormal epileptiform discharges seen during this recording. There was bi temporal focal slowing and mild diffuse slowing. There were no electrographic seizure identified.   Abnormality:  Bi temporal focal slowing  Mild diffuse slowing   Impression: This is an abnormal awake and drowsy EEG due to right and left independent focal slowing and diffuse slowing. This is suggestive of neuronal dysfunction in the bitemporal regions and a generalized brain dysfunction, such as encephalopathy, nonspecific etiology.    Omarr Hann, MD [facility]

## 2024-04-30 NOTE — Progress Notes (Signed)
 Subjective: Chief Complaint  Patient presents with   RFC     RFC Onychomycotic toenail trim. 0 pain.    89 y.o. returns the office today for painful, elongated, thickened toenails which she cannot trim herself.  She denies any open sores.  While not causing pain at this time they do rub causing discomfort as they become elongated.  [name], DO Last seen: 07/02/2023   Objective: AAO 3, NAD- presents with son  DP/PT pulses palpable, CRT less than 3 seconds Nails hypertrophic, dystrophic, elongated, brittle, discolored 10.   There is tenderness overlying the nails 1-5 bilaterally.  Mild incurvation present on the hallux toenails without any signs of infection.  There is no surrounding erythema, drainage or pus or signs of infection today.   No pain with calf compression, swelling, warmth, erythema.  Assessment: Patient presents with symptomatic onychomycosis  Plan: -Treatment options including alternatives, risks, complications were discussed -Nails sharply debrided 10 without any complications or bleeding -Daily foot inspection. -Discussed daily foot inspection. If there are any changes, to call the office immediately.   Return in about 3 months (around 07/29/2024).  Nancy Webb DPM

## 2024-05-01 ENCOUNTER — Telehealth: Payer: Self-pay | Admitting: Neurology

## 2024-05-01 NOTE — Telephone Encounter (Signed)
 Pt daughter in law Todd called stating that she would like  someone to call back to go over how many time and when does Pt use Hear monitor  . In form Daughter in law she is not on DPR  , Nurse may not call her pt . Todd stated to call Husband who is on [facility]

## 2024-05-04 ENCOUNTER — Ambulatory Visit: Payer: Self-pay | Admitting: Neurology

## 2024-05-04 NOTE — Progress Notes (Signed)
 Spoke with son Medford, informed him that EEG did not show any epileptiform discharges, only diffuse slowing. Will continue to monitor her symptoms. Please call with updates. All other questions answered.   Thanks Dr. Juandaniel Manfredo

## 2024-05-23 DIAGNOSIS — D519 Vitamin B12 deficiency anemia, unspecified: Secondary | ICD-10-CM | POA: Diagnosis not present

## 2024-05-23 DIAGNOSIS — Z79899 Other long term (current) drug therapy: Secondary | ICD-10-CM | POA: Diagnosis not present

## 2024-05-23 DIAGNOSIS — E038 Other specified hypothyroidism: Secondary | ICD-10-CM | POA: Diagnosis not present

## 2024-05-23 DIAGNOSIS — R7309 Other abnormal glucose: Secondary | ICD-10-CM | POA: Diagnosis not present

## 2024-05-23 DIAGNOSIS — E782 Mixed hyperlipidemia: Secondary | ICD-10-CM | POA: Diagnosis not present

## 2024-05-26 NOTE — Telephone Encounter (Signed)
 Pt   Daughter in law  and son called stating that  MD  placed order for  heart monitor  . Pt son Medford is requesting to speak to MD  as soon as possible . About how Pt is to use  Monitor , I have Informed  Nurse notes they are requested to speak to  MD  as soon as possible . [phone]  Todd is wife of chris you can call that number to get a hold of Pt son.

## 2024-05-29 NOTE — Telephone Encounter (Signed)
 I called Fredick Flint, [place].  Spoke to [employer].  She said that they can do this.  She will reach out to her agricultural consultant as well.  I gave her the CS# [phone].  For questions relating to device.  I relayed to Dawm , daughter in law who is NP.  She said she will take this over to pt at [facility].  She appreciated this.  I did relay for her to get on DPR for future reference.  She verbalized understanding.

## 2024-05-29 NOTE — Telephone Encounter (Signed)
 LMVM  for health/wellness at [facility] to return call re: pts care (cardiac monitor) application.

## 2024-05-29 NOTE — Telephone Encounter (Signed)
 Called spoke to family member.  Pt lives in [place] [phone]. She will need help in getting cardiac monitor (30 days - looking for arrhythmias) on at the facility Jesusa Hercules is the NP .  I called and LMVM for nurse to call back.  I called [facility]  (Customer Service # is [phone]) for assisting with device.

## 2024-07-21 ENCOUNTER — Ambulatory Visit: Admitting: Podiatry

## 2024-07-21 NOTE — Progress Notes (Unsigned)
 Subjective: Chief Complaint  Patient presents with   RFC     RFC Onychomycotic toenail trim. 0 pain.    89 y.o. returns the office today for painful, elongated, thickened toenails which she cannot trim herself.  She denies any open sores.  While not causing pain at this time they do rub causing discomfort as they become elongated.  [name], DO Last seen: 07/02/2023   Objective: AAO 3, NAD- presents with son  DP/PT pulses palpable, CRT less than 3 seconds Nails hypertrophic, dystrophic, elongated, brittle, discolored 10.   There is tenderness overlying the nails 1-5 bilaterally.  Mild incurvation present on the hallux toenails without any signs of infection.  There is no surrounding erythema, drainage or pus or signs of infection today.   No pain with calf compression, swelling, warmth, erythema.  Assessment: Patient presents with symptomatic onychomycosis  Plan: -Treatment options including alternatives, risks, complications were discussed -Nails sharply debrided 10 without any complications or bleeding -Daily foot inspection. -Discussed daily foot inspection. If there are any changes, to call the office immediately.   Return in about 3 months (around 07/29/2024).  Nancy Webb DPM

## 2024-10-20 ENCOUNTER — Ambulatory Visit: Admitting: Podiatry

## 2025-04-12 ENCOUNTER — Ambulatory Visit: Admitting: Neurology
